# Patient Record
Sex: Female | Born: 1985 | Race: White | Hispanic: No | Marital: Single | Smoking: Former smoker
Health system: Southern US, Community
[De-identification: ages and names within clinical notes are randomized; demographics above are authoritative.]

## PROBLEM LIST (undated history)

## (undated) ENCOUNTER — Inpatient Hospital Stay (HOSPITAL_COMMUNITY): Payer: Self-pay

## (undated) DIAGNOSIS — Z789 Other specified health status: Secondary | ICD-10-CM

## (undated) DIAGNOSIS — O039 Complete or unspecified spontaneous abortion without complication: Secondary | ICD-10-CM

## (undated) HISTORY — PX: NO PAST SURGERIES: SHX2092

---

## 2016-10-24 ENCOUNTER — Other Ambulatory Visit: Payer: Self-pay | Admitting: Obstetrics and Gynecology

## 2016-10-24 ENCOUNTER — Inpatient Hospital Stay (HOSPITAL_BASED_OUTPATIENT_CLINIC_OR_DEPARTMENT_OTHER)
Admission: EM | Admit: 2016-10-24 | Discharge: 2016-10-24 | Disposition: A | Discharge status: Home / Self Care | Payer: Self-pay | Attending: Obstetrics and Gynecology | Admitting: Obstetrics and Gynecology

## 2016-10-24 ENCOUNTER — Encounter (HOSPITAL_BASED_OUTPATIENT_CLINIC_OR_DEPARTMENT_OTHER): Payer: Self-pay | Admitting: *Deleted

## 2016-10-24 ENCOUNTER — Telehealth: Payer: Self-pay | Admitting: Obstetrics and Gynecology

## 2016-10-24 DIAGNOSIS — O034 Incomplete spontaneous abortion without complication: Secondary | ICD-10-CM | POA: Diagnosis present

## 2016-10-24 DIAGNOSIS — O039 Complete or unspecified spontaneous abortion without complication: Secondary | ICD-10-CM | POA: Insufficient documentation

## 2016-10-24 DIAGNOSIS — O99332 Smoking (tobacco) complicating pregnancy, second trimester: Secondary | ICD-10-CM | POA: Insufficient documentation

## 2016-10-24 DIAGNOSIS — F17200 Nicotine dependence, unspecified, uncomplicated: Secondary | ICD-10-CM | POA: Insufficient documentation

## 2016-10-24 DIAGNOSIS — O2620 Pregnancy care for patient with recurrent pregnancy loss, unspecified trimester: Secondary | ICD-10-CM | POA: Insufficient documentation

## 2016-10-24 DIAGNOSIS — Z3A16 16 weeks gestation of pregnancy: Secondary | ICD-10-CM | POA: Insufficient documentation

## 2016-10-24 HISTORY — DX: Complete or unspecified spontaneous abortion without complication: O03.9

## 2016-10-24 LAB — HCG, QUANTITATIVE, PREGNANCY: HCG, BETA CHAIN, QUANT, S: 131 m[IU]/mL — AB (ref <5)

## 2016-10-24 LAB — URINE MICROSCOPIC-ADD ON

## 2016-10-24 LAB — URINALYSIS, ROUTINE W REFLEX MICROSCOPIC
Bilirubin Urine: NEGATIVE
Glucose, UA: NEGATIVE mg/dL
Ketones, ur: NEGATIVE mg/dL
NITRITE: NEGATIVE
PROTEIN: 30 mg/dL — AB
SPECIFIC GRAVITY, URINE: 1.016 (ref 1.005–1.030)
pH: 6.5 (ref 5.0–8.0)

## 2016-10-24 LAB — PREGNANCY, URINE: PREG TEST UR: POSITIVE — AB

## 2016-10-24 LAB — ABO/RH: ABO/RH(D): B POS

## 2016-10-24 MED ORDER — IBUPROFEN 600 MG PO TABS: 600.0000 mg | ORAL_TABLET | Freq: Four times a day (QID) | ORAL | 1 refills | Status: DC | PRN

## 2016-10-24 MED ORDER — OXYTOCIN 10 UNIT/ML IJ SOLN
INTRAMUSCULAR | Status: AC
  Filled 2016-10-24: qty 4

## 2016-10-24 MED ORDER — METOCLOPRAMIDE HCL 5 MG/ML IJ SOLN
10.0000 mg | Freq: Once | INTRAMUSCULAR | Status: AC
  Administered 2016-10-24: 10 mg via INTRAVENOUS
  Filled 2016-10-24: qty 2

## 2016-10-24 NOTE — ED Provider Notes (Signed)
[facility] Provider Note   CSN: [id number] Arrival date & time: 10/24/16  [id number]     History   Chief Complaint Chief Complaint  Patient presents with  . Miscarriage    HPI Tanya Jacobs is a 30 y.o. female.  HPI Patient is a G4 P0 presenting with abdominal contractions and vaginal bleeding. She's been having the abdominal pain for the past week. Worsened. Denies any fever or chills. States she was seen a week ago for prenatal visit by her OB/GYN. Unable to find fetal heartbeat. Advised to return immediately to emergency department should her symptoms worsen. Her last menstrual period was early August 2017. Unknown blood type Past Medical History:  Diagnosis Date  . Miscarriage     There are no active problems to display for this patient.   History reviewed. No pertinent surgical history.  OB History    Gravida Para Term Preterm AB Living   4 0     3 0   SAB TAB Ectopic Multiple Live Births                  Obstetric Comments   Patient states she was told one week ago that she was approximately [redacted] weeks pregnant which was comfirmed by        Home Medications    Prior to Admission medications   Not on File    Family History No family history on file.  Social History Social History  Substance Use Topics  . Smoking status: Current Every Day Smoker  . Smokeless tobacco: Never Used  . Alcohol use Yes     Allergies   Patient has no known allergies.   Review of Systems Review of Systems  Constitutional: Negative for chills and fever.  Gastrointestinal: Positive for abdominal distention and abdominal pain. Negative for diarrhea, nausea and vomiting.  Genitourinary: Positive for pelvic pain and vaginal bleeding. Negative for vaginal discharge and vaginal pain.  Musculoskeletal: Negative for back pain and neck pain.  Skin: Negative for rash and wound.  Neurological: Negative for weakness and numbness.  All other systems reviewed and are  negative.    Physical Exam Updated Vital Signs BP 142/90 (BP Location: Right Arm)   Pulse (!) 56   Temp 98.4 F (36.9 C) (Oral)   LMP 06/30/2016   SpO2 94%   Physical Exam  Constitutional: She is oriented to person, place, and time. She appears well-developed and well-nourished. She appears distressed.  HENT:  Head: Normocephalic and atraumatic.  Mouth/Throat: Oropharynx is clear and moist.  Eyes: EOM are normal. Pupils are equal, round, and reactive to light.  Neck: Normal range of motion. Neck supple.  Cardiovascular: Normal rate and regular rhythm.   Pulmonary/Chest: Effort normal and breath sounds normal.  Abdominal: Soft. Bowel sounds are normal. She exhibits distension. There is tenderness. There is no rebound and no guarding.  Uterine fundus palpated inferior to umbilicus. Tenderness with palpation.  Genitourinary:  Genitourinary Comments: Dark blood in the vaginal vault. Dark tissue in the cervical os  Musculoskeletal: Normal range of motion. She exhibits no edema or tenderness.  Neurological: She is alert and oriented to person, place, and time.  Skin: Skin is warm and dry. Capillary refill takes less than 2 seconds. No rash noted. No erythema.  Psychiatric: She has a normal mood and affect. Her behavior is normal.  Nursing note and vitals reviewed.    ED Treatments / Results  Labs (all labs ordered are listed, but only abnormal results  are displayed) Labs Reviewed  URINALYSIS, ROUTINE W REFLEX MICROSCOPIC (NOT AT [facility]) - Abnormal; Notable for the following:       Result Value   APPearance TURBID (*)    Hgb urine dipstick LARGE (*)    Protein, ur 30 (*)    Leukocytes, UA SMALL (*)    All other components within normal limits  PREGNANCY, URINE - Abnormal; Notable for the following:    Preg Test, Ur POSITIVE (*)    All other components within normal limits  HCG, QUANTITATIVE, PREGNANCY - Abnormal; Notable for the following:    hCG, Beta Chain, Quant, S 131 (*)     All other components within normal limits  URINE MICROSCOPIC-ADD ON - Abnormal; Notable for the following:    Squamous Epithelial / LPF 0-5 (*)    Bacteria, UA FEW (*)    All other components within normal limits    EKG  EKG Interpretation None       Radiology No results found.  Procedures Procedures (including critical care time)  Medications Ordered in ED Medications  oxytocin (PITOCIN) 10 UNIT/ML injection (not administered)  metoCLOPramide (REGLAN) injection 10 mg (not administered)     Initial Impression / Assessment and Plan / ED Course  I have reviewed the triage vital signs and the nursing notes.  Pertinent labs & imaging results that were available during my care of the patient were reviewed by me and considered in my medical decision making (see chart for details).  Clinical Course    Delivered roughly 10 cm fetus at 0815. Fetus appears to be intact. No placenta noted. Patient's abdominal cramping has improved. She's much more comfortable. Discussed with Dr. Emelda FearFerguson. Recommend starting Pitocin 40 mg in 1 L of normal saline running at 125 mils per hour. Advises transfer to the MAU due to concern for retained products. He will accept in transfer.  CRITICAL CARE Performed by: Ranae PalmsYELVERTON, Avilyn Virtue Total critical care time: 25 minutes Critical care time was exclusive of separately billable procedures and treating other patients. Critical care was necessary to treat or prevent imminent or life-threatening deterioration. Critical care was time spent personally by me on the following activities: development of treatment plan with patient and/or surrogate as well as nursing, discussions with consultants, evaluation of patient's response to treatment, examination of patient, obtaining history from patient or surrogate, ordering and performing treatments and interventions, ordering and review of laboratory studies, ordering and review of radiographic studies, pulse oximetry  and re-evaluation of patient's condition. Final Clinical Impressions(s) / ED Diagnoses   Final diagnoses:  Incomplete spontaneous abortion    New Prescriptions New Prescriptions   No medications on file     Loren Raceravid Lashae Wollenberg, MD 10/24/16 [phone]

## 2016-10-24 NOTE — MAU Note (Signed)
Only one IV charted upon pt arrival but pt arrived with two IVs.  [facility] each [facility].  Both IV removed.  Catheters intact.  Clean, dry, intact.

## 2016-10-24 NOTE — ED Notes (Signed)
Patient presented to ED with intermittent lower abdominal cramping and vaginal bleeding.  States she is [redacted] weeks pregnant with a confirmed ultrasound on 10/16/16, and a confirmed fetal demise, with no HR found.  Patient is from [place].  Patient presented at 0727 to the ED.  Fetus was delivered at 0820 with no active HR or RR.  Large blood clot was delivered within 2 minutes of fetus. Products of conception was placed in sterile specimen containers, labeled with patient's labels.  Fundus massage was given for 10 minutes.  Mild bleeding noted from vagina.

## 2016-10-24 NOTE — ED Notes (Addendum)
V/O for Pitocin 40 mg in 1000 cc of Normal Saline, to infuse at 125 mg per hour by Dr. Ranae PalmsYelverton.

## 2016-10-24 NOTE — MAU Provider Note (Signed)
  History     CSN: [id number]  Arrival date and time: 10/24/16 [id number]   None     Chief Complaint  Patient presents with  . Miscarriage   HPI transferred from ED [place] for miscarriage, after spont passage of autolysed fetus. Pitocin infusing at 125/hr. +cramping. ABO RH by pt hx , she's confident it is Rh PO  Past Medical History:  Diagnosis Date  . Miscarriage     Past Surgical History:  Procedure Laterality Date  . NO PAST SURGERIES      History reviewed. No pertinent family history.  Social History  Substance Use Topics  . Smoking status: Current Every Day Smoker  . Smokeless tobacco: Never Used  . Alcohol use Yes    Allergies: No Known Allergies  Prescriptions Prior to Admission  Medication Sig Dispense Refill Last Dose  . Prenatal Vit-Fe Fumarate-FA (MULTIVITAMIN-PRENATAL) 27-0.8 MG TABS tablet Take 1 tablet by mouth daily at 12 noon.   Past Week at Unknown time    ROS Physical Exam   Blood pressure 114/73, pulse 66, temperature 98 F (36.7 C), temperature source Oral, resp. rate 17, last menstrual period 06/30/2016, SpO2 96 %.  Physical Exam  Constitutional: She appears well-developed and well-nourished.  HENT:  Head: Normocephalic.  Eyes: Pupils are equal, round, and reactive to light.  Neck: Normal range of motion.  Cardiovascular: Normal rate.   Respiratory: Effort normal.  GI: Soft.  Genitourinary: Vagina normal.  Genitourinary Comments: EFG heavy blood Vag: Clots 200 cc removed. Cx 2 cm open, with tissue, able to be extracted with ring forceps from lower ut segment.  see procedure note  MAU Course  Procedures Ring Forceps used to grasp tissue in the internal os, and lower ut segment, with  Ultrasound used at bedside to confirm by transabdominal scan that the uterus is satisfactorily evacuated and no significant tissue in uterine cavity.  There is a small simple left ovarian cyst 4 cm max diameter in left adnexa. Normal pelvic  fluid.  MDM Exam, uterine extraction of POC's , u/s of uterus.  Assessment and Plan  Spontaneous abortion, 16 wk. ABO Rh ordered. Discharged at 1:30 pm stable for d/c   Keefer Soulliere V 10/24/2016, 12:48 PM

## 2016-10-24 NOTE — ED Triage Notes (Signed)
Patient and partner states the patient was told one week ago in [place], that she is currently having a miscarriage and a fetal demise.  Bleeding for one week, worse last night with abdominal pain.

## 2016-10-24 NOTE — MAU Note (Signed)
Pt arrived via [facility] from [facility] [place] [facility].  Pt states she delivered the baby at [facility].  Pt was given pain medication by [facility].

## 2016-10-24 NOTE — ED Notes (Signed)
Pt left with [facility] 

## 2016-10-24 NOTE — Discharge Instructions (Signed)

## 2016-10-26 NOTE — Telephone Encounter (Signed)
Call received from [facility] [place] regarding this pt , who has passed fetus and not placenta,  Pt accepted in transfer.

## 2017-07-08 ENCOUNTER — Other Ambulatory Visit (HOSPITAL_COMMUNITY): Payer: Self-pay | Admitting: Nurse Practitioner

## 2017-07-08 DIAGNOSIS — Z3682 Encounter for antenatal screening for nuchal translucency: Secondary | ICD-10-CM

## 2017-07-08 LAB — OB RESULTS CONSOLE GC/CHLAMYDIA
CHLAMYDIA, DNA PROBE: NEGATIVE
Gonorrhea: NEGATIVE

## 2017-07-08 LAB — OB RESULTS CONSOLE RUBELLA ANTIBODY, IGM: Rubella: IMMUNE

## 2017-07-08 LAB — OB RESULTS CONSOLE HIV ANTIBODY (ROUTINE TESTING): HIV: NONREACTIVE

## 2017-07-22 ENCOUNTER — Encounter (HOSPITAL_COMMUNITY): Payer: Self-pay | Admitting: *Deleted

## 2017-07-23 ENCOUNTER — Encounter (HOSPITAL_COMMUNITY): Payer: Self-pay

## 2017-07-23 ENCOUNTER — Ambulatory Visit (HOSPITAL_COMMUNITY)
Admission: RE | Admit: 2017-07-23 | Discharge: 2017-07-23 | Disposition: A | Discharge status: Home / Self Care | Payer: Medicaid Other | Source: Ambulatory Visit | Attending: Nurse Practitioner | Admitting: Nurse Practitioner

## 2017-07-23 DIAGNOSIS — Z3682 Encounter for antenatal screening for nuchal translucency: Secondary | ICD-10-CM | POA: Insufficient documentation

## 2017-07-23 DIAGNOSIS — Z3A12 12 weeks gestation of pregnancy: Secondary | ICD-10-CM | POA: Insufficient documentation

## 2017-07-28 ENCOUNTER — Inpatient Hospital Stay (HOSPITAL_COMMUNITY)
Admission: AD | Admit: 2017-07-28 | Discharge: 2017-07-28 | Disposition: A | Discharge status: Home / Self Care | Payer: Medicaid Other | Source: Ambulatory Visit | Attending: Family Medicine | Admitting: Family Medicine

## 2017-07-28 ENCOUNTER — Encounter (HOSPITAL_COMMUNITY): Payer: Self-pay | Admitting: *Deleted

## 2017-07-28 DIAGNOSIS — W19XXXA Unspecified fall, initial encounter: Secondary | ICD-10-CM

## 2017-07-28 DIAGNOSIS — Y92009 Unspecified place in unspecified non-institutional (private) residence as the place of occurrence of the external cause: Secondary | ICD-10-CM

## 2017-07-28 DIAGNOSIS — R55 Syncope and collapse: Secondary | ICD-10-CM

## 2017-07-28 DIAGNOSIS — R079 Chest pain, unspecified: Secondary | ICD-10-CM | POA: Insufficient documentation

## 2017-07-28 HISTORY — DX: Other specified health status: Z78.9

## 2017-07-28 LAB — URINALYSIS, ROUTINE W REFLEX MICROSCOPIC
BILIRUBIN URINE: NEGATIVE
Bacteria, UA: NONE SEEN
GLUCOSE, UA: NEGATIVE mg/dL
HGB URINE DIPSTICK: NEGATIVE
KETONES UR: NEGATIVE mg/dL
Nitrite: NEGATIVE
PROTEIN: 30 mg/dL — AB
Specific Gravity, Urine: 1.025 (ref 1.005–1.030)
pH: 6 (ref 5.0–8.0)

## 2017-07-28 LAB — GLUCOSE, CAPILLARY: Glucose-Capillary: 84 mg/dL (ref 65–99)

## 2017-07-28 NOTE — MAU Note (Signed)
Pt states she blacked out about an hour ago, was sitting outside & got hot, went inside, then woke up on the floor. Her friends said she hit her head very hard, has red raised area on R forehead.  Also hit right shoulder.  States she didn't know where she was when she woke up.  Pt states she ate a meal @ 1000 but has not drank much water today.  Denies abd pain or bleeding.

## 2017-07-28 NOTE — MAU Note (Signed)
Pt reports she passed out approximately 1 hour ago.  Reports she was sitting outside, returned inside because she became too hot.  Once inside, woke up lying in the floor.  Pt states upon awakening, she realized she hit her head and shoulder on the left side.  Reddened area noted on L forehead & shoulder.  Denies abdominal cramping or bleeding.

## 2017-07-28 NOTE — Discharge Instructions (Signed)
Near-Syncope °Near-syncope is when you suddenly get weak or dizzy, or you feel like you might pass out (faint). During an episode of near-syncope, you may: °· Feel dizzy or light-headed. °· Feel sick to your stomach (nauseous). °· See all white or all black. °· Have cold, clammy skin. ° °If you passed out, get help right away.Call your local emergency services (911 in the [place] Do not drive yourself to the hospital. °Follow these instructions at home: °Pay attention to any changes in your symptoms. Take these actions to help with your condition: °· Have someone stay with you until you feel stable. °· Do not drive, use machinery, or play sports until your doctor says it is okay. °· Keep all follow-up visits as told by your doctor. This is important. °· If you start to feel like you might pass out, lie down right away and raise (elevate) your feet above the level of your heart. Breathe deeply and steadily. Wait until all of the symptoms are gone. °· Drink enough fluid to keep your pee (urine) clear or pale yellow. °· If you are taking blood pressure or heart medicine, get up slowly and spend many minutes getting ready to sit and then stand. This can help with dizziness. °· Take over-the-counter and prescription medicines only as told by your doctor. ° °Get help right away if: °· You have a very bad headache. °· You have unusual pain in your chest, tummy, or back. °· You are bleeding from your mouth or rectum. °· You have black or tarry poop (stool). °· You have a very fast or uneven heartbeat (palpitations). °· You pass out one time or more than once. °· You have jerky movements that you cannot control (seizure). °· You are confused. °· You have trouble walking. °· You are very weak. °· You have vision problems. °These symptoms may be an emergency. Do not wait to see if the symptoms will go away. Get medical help right away. Call your local emergency services (911 in the [place] Do not drive yourself to the  hospital. °This information is not intended to replace advice given to you by your health care provider. Make sure you discuss any questions you have with your health care provider. °Document Released: 05/04/2008 Document Revised: 04/23/2016 Document Reviewed: 07/31/2015 °Elsevier Interactive Patient Education © 2017 Elsevier Inc. ° °

## 2017-07-28 NOTE — MAU Provider Note (Signed)
S:  HPI: Beverlye Jacobs is a 31 year old female that presents with the complaints of a syncopal episode. One hour prior to arrival she was sitting outside, stood up to go inside, made it inside but them remembers waking up on the floor. Pt did strike left side of forehead and left shoulder. Pt endorses eating today (bowl of Cheerios with milk) but states she has not had much fluid intake, and has not eaten lunch. [facility]   O:  Vitals:   07/28/17 1300 07/28/17 1551  BP: (!) 97/58 (!) 99/51  Pulse: 75 72  Resp: 18 18  Temp: 98.1 F (36.7 C) 97.8 F (36.6 C)  TempSrc: Oral Oral  SpO2: 99% 100%    .Marland KitchenReview of Systems  Respiratory: Negative for shortness of breath.   Cardiovascular: Negative for chest pain and palpitations.  Musculoskeletal: Positive for falls and joint pain.  Neurological: Positive for dizziness and loss of consciousness. Negative for headaches.   Marland Kitchen.Physical Exam  Constitutional: She is oriented to person, place, and time and well-developed, well-nourished, and in no distress.  HENT:  Head: Head is with contusion.    Cardiovascular: Normal rate, regular rhythm, S1 normal and S2 normal.   Musculoskeletal:       Right shoulder: She exhibits tenderness and pain. She exhibits normal pulse and normal strength.  Neurological: She is alert and oriented to person, place, and time. She has normal sensation, normal strength and intact cranial nerves. GCS score is 15.   Recent Results (from the past 2160 hour(s))  Urinalysis, Routine w reflex microscopic     Status: Abnormal   Collection Time: 07/28/17  1:04 PM  Result Value Ref Range   Color, Urine Tanya Jacobs (A) YELLOW    Comment: BIOCHEMICALS MAY BE AFFECTED BY COLOR   APPearance CLOUDY (A) CLEAR   Specific Gravity, Urine 1.025 1.005 - 1.030   pH 6.0 5.0 - 8.0   Glucose, UA NEGATIVE NEGATIVE mg/dL   Hgb urine dipstick NEGATIVE NEGATIVE   Bilirubin Urine NEGATIVE NEGATIVE   Ketones, ur NEGATIVE NEGATIVE mg/dL   Protein, ur 30 (A) NEGATIVE mg/dL   Nitrite NEGATIVE NEGATIVE   Leukocytes, UA TRACE (A) NEGATIVE   RBC / HPF 0-5 0 - 5 RBC/hpf   WBC, UA 6-30 0 - 5 WBC/hpf   Bacteria, UA NONE SEEN NONE SEEN   Squamous Epithelial / LPF 6-30 (A) NONE SEEN   Mucus PRESENT   Glucose, capillary     Status: None   Collection Time: 07/28/17  2:26 PM  Result Value Ref Range   Glucose-Capillary 84 65 - 99 mg/dL    MDM:  + fetal heart tones via doppler  Glucose WNL UA Orthostatic vitals WNL  Patient denies HA at the time of discharge.    A:  1. Syncope, vasovagal   2. Fall in home, initial encounter     P:  Discharge home in stable condition If symptoms worsen go to [facility] ED Discussed healthy eat plan, with frequent snacks  I confirm that I have verified the information documented in the nurse practitioner's note and that I have also personally reperformed the physical exam and all medical decision making activities.   Duane Lope, NP 07/28/2017 6:10 PM

## 2017-11-17 LAB — OB RESULTS CONSOLE HIV ANTIBODY (ROUTINE TESTING): HIV: NONREACTIVE

## 2017-11-30 NOTE — L&D Delivery Note (Signed)
Patient is a 32 y.o. now [id number] s/p NSVD at 4334w2d, who was admitted for SOL.  She progressed with augmentation to complete.  Cord clamping delayed by several minutes then clamped by me with supervision and cut by father of the baby.  Placenta intact and spontaneous, bleeding minimal.  Few labial abrasions noted but no lacerations requiring repair. She requests Depo for birth control.  Delivery Note At 3:36 PM a viable female was delivered via Vaginal, Spontaneous (Presentation: LOA).  APGAR: 8, 9; weight pending.   Placenta status: intact.  Cord: 3V with the following complications: nuchal x1, reduced after delivery with no complications.  Cord blood sent.  Anesthesia: Epidural Episiotomy: None Lacerations: None Suture Repair: None Est. Blood Loss (mL): 200  Mom to postpartum.  Baby to Couplet care / Skin to Skin.  Ellwood DenseAlison Rumball, DO 01/25/18, 3:59 PM

## 2017-12-20 ENCOUNTER — Encounter (HOSPITAL_COMMUNITY): Payer: Self-pay | Admitting: *Deleted

## 2017-12-20 ENCOUNTER — Inpatient Hospital Stay (HOSPITAL_COMMUNITY)
Admission: AD | Admit: 2017-12-20 | Discharge: 2017-12-20 | Disposition: A | Discharge status: Home / Self Care | Payer: Medicaid Other | Source: Ambulatory Visit | Attending: Obstetrics and Gynecology | Admitting: Obstetrics and Gynecology

## 2017-12-20 DIAGNOSIS — O2240 Hemorrhoids in pregnancy, unspecified trimester: Secondary | ICD-10-CM | POA: Diagnosis not present

## 2017-12-20 DIAGNOSIS — O99613 Diseases of the digestive system complicating pregnancy, third trimester: Secondary | ICD-10-CM | POA: Diagnosis not present

## 2017-12-20 DIAGNOSIS — Z8249 Family history of ischemic heart disease and other diseases of the circulatory system: Secondary | ICD-10-CM | POA: Diagnosis not present

## 2017-12-20 DIAGNOSIS — R109 Unspecified abdominal pain: Secondary | ICD-10-CM

## 2017-12-20 DIAGNOSIS — Z809 Family history of malignant neoplasm, unspecified: Secondary | ICD-10-CM | POA: Insufficient documentation

## 2017-12-20 DIAGNOSIS — Z833 Family history of diabetes mellitus: Secondary | ICD-10-CM | POA: Insufficient documentation

## 2017-12-20 DIAGNOSIS — F17200 Nicotine dependence, unspecified, uncomplicated: Secondary | ICD-10-CM | POA: Insufficient documentation

## 2017-12-20 DIAGNOSIS — Z888 Allergy status to other drugs, medicaments and biological substances status: Secondary | ICD-10-CM | POA: Insufficient documentation

## 2017-12-20 DIAGNOSIS — O2243 Hemorrhoids in pregnancy, third trimester: Secondary | ICD-10-CM | POA: Diagnosis not present

## 2017-12-20 DIAGNOSIS — K921 Melena: Secondary | ICD-10-CM | POA: Diagnosis present

## 2017-12-20 DIAGNOSIS — O9989 Other specified diseases and conditions complicating pregnancy, childbirth and the puerperium: Secondary | ICD-10-CM | POA: Diagnosis not present

## 2017-12-20 DIAGNOSIS — K5900 Constipation, unspecified: Secondary | ICD-10-CM | POA: Insufficient documentation

## 2017-12-20 DIAGNOSIS — Z8759 Personal history of other complications of pregnancy, childbirth and the puerperium: Secondary | ICD-10-CM | POA: Diagnosis not present

## 2017-12-20 DIAGNOSIS — Z3A34 34 weeks gestation of pregnancy: Secondary | ICD-10-CM | POA: Diagnosis not present

## 2017-12-20 DIAGNOSIS — O99333 Smoking (tobacco) complicating pregnancy, third trimester: Secondary | ICD-10-CM | POA: Insufficient documentation

## 2017-12-20 LAB — URINALYSIS, ROUTINE W REFLEX MICROSCOPIC
Bilirubin Urine: NEGATIVE
GLUCOSE, UA: NEGATIVE mg/dL
Hgb urine dipstick: NEGATIVE
KETONES UR: 5 mg/dL — AB
Nitrite: NEGATIVE
Protein, ur: 30 mg/dL — AB
Specific Gravity, Urine: 1.024 (ref 1.005–1.030)
pH: 6 (ref 5.0–8.0)

## 2017-12-20 LAB — OB RESULTS CONSOLE GBS: GBS: NEGATIVE

## 2017-12-20 NOTE — Discharge Instructions (Signed)
Constipation, Adult °Constipation is when a person has fewer bowel movements in a week than normal, has difficulty having a bowel movement, or has stools that are dry, hard, or larger than normal. Constipation may be caused by an underlying condition. It may become worse with age if a person takes certain medicines and does not take in enough fluids. °Follow these instructions at home: °Eating and drinking ° °· Eat foods that have a lot of fiber, such as fresh fruits and vegetables, whole grains, and beans. °· Limit foods that are high in fat, low in fiber, or overly processed, such as french fries, hamburgers, cookies, candies, and soda. °· Drink enough fluid to keep your urine clear or pale yellow. °General instructions °· Exercise regularly or as told by your health care provider. °· Go to the restroom when you have the urge to go. Do not hold it in. °· Take over-the-counter and prescription medicines only as told by your health care provider. These include any fiber supplements. °· Practice pelvic floor retraining exercises, such as deep breathing while relaxing the lower abdomen and pelvic floor relaxation during bowel movements. °· Watch your condition for any changes. °· Keep all follow-up visits as told by your health care provider. This is important. °Contact a health care provider if: °· You have pain that gets worse. °· You have a fever. °· You do not have a bowel movement after 4 days. °· You vomit. °· You are not hungry. °· You lose weight. °· You are bleeding from the anus. °· You have thin, pencil-like stools. °Get help right away if: °· You have a fever and your symptoms suddenly get worse. °· You leak stool or have blood in your stool. °· Your abdomen is bloated. °· You have severe pain in your abdomen. °· You feel dizzy or you faint. °This information is not intended to replace advice given to you by your health care provider. Make sure you discuss any questions you have with your health care  provider. °Document Released: 08/14/2004 Document Revised: 06/05/2016 Document Reviewed: 05/06/2016 °Elsevier Interactive Patient Education © 2018 Elsevier Inc ° °Safe Medications in Pregnancy  ° °Acne: °Benzoyl Peroxide °Salicylic Acid ° °Backache/Headache: °Tylenol: 2 regular strength every 4 hours OR °             2 Extra strength every 6 hours ° °Colds/Coughs/Allergies: °Benadryl (alcohol free) 25 mg every 6 hours as needed °Breath right strips °Claritin °Cepacol throat lozenges °Chloraseptic throat spray °Cold-Eeze- up to three times per day °Cough drops, alcohol free °Flonase (by prescription only) °Guaifenesin °Mucinex °Robitussin DM (plain only, alcohol free) °Saline nasal spray/drops °Sudafed (pseudoephedrine) & Actifed ** use only after [redacted] weeks gestation and if you do not have high blood pressure °Tylenol °Vicks Vaporub °Zinc lozenges °Zyrtec  ° °Constipation: °Colace °Ducolax suppositories °Fleet enema °Glycerin suppositories °Metamucil °Milk of magnesia °Miralax °Senokot °Smooth move tea ° °Diarrhea: °Kaopectate °Imodium A-D ° °*NO pepto Bismol ° °Hemorrhoids: °Anusol °Anusol HC °Preparation H °Tucks ° °Indigestion: °Tums °Maalox °Mylanta °Zantac  °Pepcid ° °Insomnia: °Benadryl (alcohol free) 25mg every 6 hours as needed °Tylenol PM °Unisom, no Gelcaps ° °Leg Cramps: °Tums °MagGel ° °Nausea/Vomiting:  °Bonine °Dramamine °Emetrol °Ginger extract °Sea bands °Meclizine  °Nausea medication to take during pregnancy:  °Unisom (doxylamine succinate 25 mg tablets) Take one tablet daily at bedtime. If symptoms are not adequately controlled, the dose can be increased to a maximum recommended dose of two tablets daily (1/2 tablet in the morning, 1/2   tablet mid-afternoon and one at bedtime). °Vitamin B6 100mg tablets. Take one tablet twice a day (up to 200 mg per day). ° °Skin Rashes: °Aveeno products °Benadryl cream or 25mg every 6 hours as needed °Calamine Lotion °1% cortisone cream ° °Yeast  infection: °Gyne-lotrimin 7 °Monistat 7 ° ° °**If taking multiple medications, please check labels to avoid duplicating the same active ingredients °**take medication as directed on the label °** Do not exceed 4000 mg of tylenol in 24 hours °**Do not take medications that contain aspirin or ibuprofen ° ° ° ° °

## 2017-12-20 NOTE — MAU Provider Note (Signed)
History     CSN: [id number]  Arrival date and time: 12/20/17 [id number]   First Provider Initiated Contact with Patient 12/20/17 [phone]     Chief Complaint  Patient presents with  . blood in stool  . Abdominal Pain   HPI Tanya Jacobs is a 32 y.o. G5P0040 at 4825w1d who presents with blood in her stool and abdominal pain. She states she was feeling constipated last night so she took milk of magnesia last night around midnight. Last bowel movement was 2 days ago but small and hard. She frequently strains to have a BM. She also reports lower abdominal cramping that she rates a 7/10 and has not tried anything for. She denies any contractions. She denies any leaking or bleeding. Reports good fetal movement. [facility]  OB History    Gravida Para Term Preterm AB Living   5 0     4 0   SAB TAB Ectopic Multiple Live Births   2 2 0 0 0      Obstetric Comments   Patient states she was told one week ago that she was approximately [redacted] weeks pregnant which was comfirmed by       Past Medical History:  Diagnosis Date  . Medical history non-contributory   . Miscarriage     Past Surgical History:  Procedure Laterality Date  . NO PAST SURGERIES      Family History  Problem Relation Age of Onset  . Cancer Maternal Grandmother   . Diabetes Paternal Grandmother   . Hypertension Paternal Grandfather     Social History   Tobacco Use  . Smoking status: Current Every Day Smoker  . Smokeless tobacco: Never Used  Substance Use Topics  . Alcohol use: Yes  . Drug use: Yes    Types: Marijuana    Allergies:  Allergies  Allergen Reactions  . Chlorine Shortness Of Breath    No medications prior to admission.    Review of Systems  Constitutional: Negative.  Negative for fatigue and fever.  HENT: Negative.   Respiratory: Negative.  Negative for shortness of breath.   Cardiovascular: Negative.  Negative for chest pain.  Gastrointestinal: Negative.  Negative for abdominal pain,  constipation, diarrhea, nausea and vomiting.  Genitourinary: Negative.  Negative for dysuria.  Neurological: Negative.  Negative for dizziness and headaches.   Physical Exam   Blood pressure 112/74, pulse 73, temperature 97.7 F (36.5 C), resp. rate 17, weight 157 lb 0.6 oz (71.2 kg), last menstrual period 04/25/2017, SpO2 98 %, unknown if currently breastfeeding.  Physical Exam  Nursing note and vitals reviewed. Constitutional: She is oriented to person, place, and time. She appears well-developed and well-nourished. No distress.  HENT:  Head: Normocephalic.  Eyes: Pupils are equal, round, and reactive to light.  Cardiovascular: Normal rate, regular rhythm and normal heart sounds.  Respiratory: Effort normal and breath sounds normal. No respiratory distress.  GI: Soft. Bowel sounds are normal. She exhibits no distension. There is no tenderness.  Genitourinary: Rectal exam shows internal hemorrhoid. Rectal exam shows no external hemorrhoid, no fissure and no tenderness.  Neurological: She is alert and oriented to person, place, and time.  Skin: Skin is warm and dry.  Psychiatric: She has a normal mood and affect. Her behavior is normal. Judgment and thought content normal.    Fetal Tracing:  Baseline: 125 Variability: moderate Accels: 15x15 Decels: none  Toco: 1 uc   MAU Course  Procedures Results for orders placed or performed  during the hospital encounter of 12/20/17 (from the past 24 hour(s))  Urinalysis, Routine w reflex microscopic     Status: Abnormal   Collection Time: 12/20/17  7:49 AM  Result Value Ref Range   Color, Urine Arleigh (A) YELLOW   APPearance HAZY (A) CLEAR   Specific Gravity, Urine 1.024 1.005 - 1.030   pH 6.0 5.0 - 8.0   Glucose, UA NEGATIVE NEGATIVE mg/dL   Hgb urine dipstick NEGATIVE NEGATIVE   Bilirubin Urine NEGATIVE NEGATIVE   Ketones, ur 5 (A) NEGATIVE mg/dL   Protein, ur 30 (A) NEGATIVE mg/dL   Nitrite NEGATIVE NEGATIVE   Leukocytes, UA  SMALL (A) NEGATIVE   RBC / HPF 0-5 0 - 5 RBC/hpf   WBC, UA 6-30 0 - 5 WBC/hpf   Bacteria, UA RARE (A) NONE SEEN   Squamous Epithelial / LPF 6-30 (A) NONE SEEN   Mucus PRESENT      MDM UA Urine culture NST- reactive Patient states abdominal pain is intermittent and worse when she has to go to the bathroom. No blood noted on exam. Patient states last BM this am had no blood. Assessment and Plan   1. Hemorrhoids during pregnancy in third trimester   2. Constipation during pregnancy in third trimester    -Discharge home in stable condition -Comfort measures for hemorrhoids discussed. Increase fluids and green leafy vegetables. OTC options for constipation reviewed. -Preterm labor precautions discussed -Patient advised to follow-up with [facility] as scheduled to continue prenatal care -Patient may return to MAU as needed or if her condition were to change or worsen  Rolm Bookbinder CNM 12/20/2017, 8:41 AM

## 2017-12-20 NOTE — MAU Note (Signed)
Patient c/o  + Blood in stool. Felt constipated last night and took milk of mag around midnight. Noticed a lot of blood in the toilet; has continued to bleed even when just urinating she reports.  +lower abdominal pain Sharp and cramping in nature Constant Rating pain 7/10

## 2017-12-21 LAB — CULTURE, OB URINE: Culture: <10000 — AB

## 2017-12-29 LAB — OB RESULTS CONSOLE HEPATITIS B SURFACE ANTIGEN: HEP B S AG: NEGATIVE

## 2018-01-25 ENCOUNTER — Encounter (HOSPITAL_COMMUNITY): Payer: Self-pay

## 2018-01-25 ENCOUNTER — Inpatient Hospital Stay (HOSPITAL_COMMUNITY): Payer: Medicaid Other | Admitting: Anesthesiology

## 2018-01-25 ENCOUNTER — Inpatient Hospital Stay (HOSPITAL_COMMUNITY)
Admission: AD | Admit: 2018-01-25 | Discharge: 2018-01-27 | DRG: 806 | Disposition: A | Discharge status: Home / Self Care | Payer: Medicaid Other | Source: Ambulatory Visit | Attending: Family Medicine | Admitting: Family Medicine

## 2018-01-25 DIAGNOSIS — F1290 Cannabis use, unspecified, uncomplicated: Secondary | ICD-10-CM | POA: Diagnosis present

## 2018-01-25 DIAGNOSIS — Z3A39 39 weeks gestation of pregnancy: Secondary | ICD-10-CM | POA: Diagnosis not present

## 2018-01-25 DIAGNOSIS — O99324 Drug use complicating childbirth: Secondary | ICD-10-CM | POA: Diagnosis present

## 2018-01-25 DIAGNOSIS — Z3483 Encounter for supervision of other normal pregnancy, third trimester: Secondary | ICD-10-CM | POA: Diagnosis present

## 2018-01-25 DIAGNOSIS — Z3493 Encounter for supervision of normal pregnancy, unspecified, third trimester: Secondary | ICD-10-CM

## 2018-01-25 DIAGNOSIS — O99334 Smoking (tobacco) complicating childbirth: Secondary | ICD-10-CM | POA: Diagnosis present

## 2018-01-25 DIAGNOSIS — F17200 Nicotine dependence, unspecified, uncomplicated: Secondary | ICD-10-CM | POA: Diagnosis present

## 2018-01-25 LAB — CBC
HCT: 35.1 % — ABNORMAL LOW (ref 36.0–46.0)
Hemoglobin: 11.9 g/dL — ABNORMAL LOW (ref 12.0–15.0)
MCH: 31.1 pg (ref 26.0–34.0)
MCHC: 33.9 g/dL (ref 30.0–36.0)
MCV: 91.6 fL (ref 78.0–100.0)
PLATELETS: 370 10*3/uL (ref 150–400)
RBC: 3.83 MIL/uL — AB (ref 3.87–5.11)
RDW: 13.1 % (ref 11.5–15.5)
WBC: 16.3 10*3/uL — AB (ref 4.0–10.5)

## 2018-01-25 LAB — RPR: RPR: NONREACTIVE

## 2018-01-25 LAB — ABO/RH: ABO/RH(D): B POS

## 2018-01-25 LAB — TYPE AND SCREEN
ABO/RH(D): B POS
ANTIBODY SCREEN: NEGATIVE

## 2018-01-25 MED ORDER — PHENYLEPHRINE 40 MCG/ML (10ML) SYRINGE FOR IV PUSH (FOR BLOOD PRESSURE SUPPORT)
80.0000 ug | PREFILLED_SYRINGE | INTRAVENOUS | Status: DC | PRN
  Filled 2018-01-25: qty 5

## 2018-01-25 MED ORDER — SOD CITRATE-CITRIC ACID 500-334 MG/5ML PO SOLN: 30.0000 mL | ORAL | Status: DC | PRN

## 2018-01-25 MED ORDER — ONDANSETRON HCL 4 MG/2ML IJ SOLN: 4.0000 mg | INTRAMUSCULAR | Status: DC | PRN

## 2018-01-25 MED ORDER — BENZOCAINE-MENTHOL 20-0.5 % EX AERO
1.0000 "application " | INHALATION_SPRAY | CUTANEOUS | Status: DC | PRN
  Administered 2018-01-25: 1 via TOPICAL
  Filled 2018-01-25: qty 56

## 2018-01-25 MED ORDER — SIMETHICONE 80 MG PO CHEW: 80.0000 mg | CHEWABLE_TABLET | ORAL | Status: DC | PRN

## 2018-01-25 MED ORDER — SENNOSIDES-DOCUSATE SODIUM 8.6-50 MG PO TABS
2.0000 | ORAL_TABLET | ORAL | Status: DC
  Administered 2018-01-25 – 2018-01-27 (×2): 2 via ORAL
  Filled 2018-01-25 (×2): qty 2

## 2018-01-25 MED ORDER — FENTANYL CITRATE (PF) 100 MCG/2ML IJ SOLN
100.0000 ug | INTRAMUSCULAR | Status: DC | PRN
Start: 2018-01-25 — End: 2018-01-25
  Administered 2018-01-25: 100 ug via INTRAVENOUS
  Filled 2018-01-25: qty 2

## 2018-01-25 MED ORDER — LACTATED RINGERS IV SOLN
INTRAVENOUS | Status: DC
  Administered 2018-01-25: 11:00:00 via INTRAVENOUS

## 2018-01-25 MED ORDER — ACETAMINOPHEN 325 MG PO TABS: 650.0000 mg | ORAL_TABLET | ORAL | Status: DC | PRN

## 2018-01-25 MED ORDER — LACTATED RINGERS IV SOLN: 500.0000 mL | Freq: Once | INTRAVENOUS | Status: DC

## 2018-01-25 MED ORDER — LIDOCAINE HCL (PF) 1 % IJ SOLN
INTRAMUSCULAR | Status: DC | PRN
  Administered 2018-01-25: 2 mL
  Administered 2018-01-25: 3 mL
  Administered 2018-01-25: 5 mL

## 2018-01-25 MED ORDER — TETANUS-DIPHTH-ACELL PERTUSSIS 5-2.5-18.5 LF-MCG/0.5 IM SUSP: 0.5000 mL | Freq: Once | INTRAMUSCULAR | Status: DC

## 2018-01-25 MED ORDER — FENTANYL 2.5 MCG/ML BUPIVACAINE 1/10 % EPIDURAL INFUSION (WH - ANES)
14.0000 mL/h | INTRAMUSCULAR | Status: DC | PRN
  Administered 2018-01-25 (×2): 14 mL/h via EPIDURAL
  Filled 2018-01-25 (×2): qty 100

## 2018-01-25 MED ORDER — ONDANSETRON HCL 4 MG PO TABS: 4.0000 mg | ORAL_TABLET | ORAL | Status: DC | PRN

## 2018-01-25 MED ORDER — TERBUTALINE SULFATE 1 MG/ML IJ SOLN
0.2500 mg | Freq: Once | INTRAMUSCULAR | Status: DC | PRN
  Filled 2018-01-25: qty 1

## 2018-01-25 MED ORDER — COCONUT OIL OIL
1.0000 "application " | TOPICAL_OIL | Status: DC | PRN
  Administered 2018-01-26: 1 via TOPICAL
  Filled 2018-01-25: qty 120

## 2018-01-25 MED ORDER — EPHEDRINE 5 MG/ML INJ
10.0000 mg | INTRAVENOUS | Status: DC | PRN
  Filled 2018-01-25: qty 2

## 2018-01-25 MED ORDER — PHENYLEPHRINE 40 MCG/ML (10ML) SYRINGE FOR IV PUSH (FOR BLOOD PRESSURE SUPPORT)
80.0000 ug | PREFILLED_SYRINGE | INTRAVENOUS | Status: DC | PRN
  Filled 2018-01-25: qty 5
  Filled 2018-01-25: qty 10

## 2018-01-25 MED ORDER — DIPHENHYDRAMINE HCL 50 MG/ML IJ SOLN
12.5000 mg | INTRAMUSCULAR | Status: DC | PRN
Start: 2018-01-25 — End: 2018-01-25

## 2018-01-25 MED ORDER — PRENATAL MULTIVITAMIN CH
1.0000 | ORAL_TABLET | Freq: Every day | ORAL | Status: DC
  Administered 2018-01-26 – 2018-01-27 (×2): 1 via ORAL
  Filled 2018-01-25 (×2): qty 1

## 2018-01-25 MED ORDER — ZOLPIDEM TARTRATE 5 MG PO TABS: 5.0000 mg | ORAL_TABLET | Freq: Every evening | ORAL | Status: DC | PRN

## 2018-01-25 MED ORDER — DIPHENHYDRAMINE HCL 25 MG PO CAPS: 25.0000 mg | ORAL_CAPSULE | Freq: Four times a day (QID) | ORAL | Status: DC | PRN

## 2018-01-25 MED ORDER — EPHEDRINE 5 MG/ML INJ: 10.0000 mg | INTRAVENOUS | Status: DC | PRN

## 2018-01-25 MED ORDER — LIDOCAINE HCL (PF) 1 % IJ SOLN
30.0000 mL | INTRAMUSCULAR | Status: DC | PRN
Start: 2018-01-25 — End: 2018-01-25
  Filled 2018-01-25: qty 30

## 2018-01-25 MED ORDER — DIBUCAINE 1 % RE OINT: 1.0000 "application " | TOPICAL_OINTMENT | RECTAL | Status: DC | PRN

## 2018-01-25 MED ORDER — ONDANSETRON HCL 4 MG/2ML IJ SOLN
4.0000 mg | Freq: Four times a day (QID) | INTRAMUSCULAR | Status: DC | PRN
  Administered 2018-01-25: 4 mg via INTRAVENOUS
  Filled 2018-01-25: qty 2

## 2018-01-25 MED ORDER — OXYCODONE-ACETAMINOPHEN 5-325 MG PO TABS: 2.0000 | ORAL_TABLET | ORAL | Status: DC | PRN

## 2018-01-25 MED ORDER — LACTATED RINGERS IV SOLN: 500.0000 mL | INTRAVENOUS | Status: DC | PRN

## 2018-01-25 MED ORDER — OXYTOCIN BOLUS FROM INFUSION
500.0000 mL | Freq: Once | INTRAVENOUS | Status: AC
  Administered 2018-01-25: 500 mL via INTRAVENOUS

## 2018-01-25 MED ORDER — IBUPROFEN 600 MG PO TABS
600.0000 mg | ORAL_TABLET | Freq: Four times a day (QID) | ORAL | Status: DC
  Administered 2018-01-25 – 2018-01-27 (×8): 600 mg via ORAL
  Filled 2018-01-25 (×8): qty 1

## 2018-01-25 MED ORDER — OXYTOCIN 40 UNITS IN LACTATED RINGERS INFUSION - SIMPLE MED
2.5000 [IU]/h | INTRAVENOUS | Status: DC
  Filled 2018-01-25: qty 1000

## 2018-01-25 MED ORDER — PHENYLEPHRINE 40 MCG/ML (10ML) SYRINGE FOR IV PUSH (FOR BLOOD PRESSURE SUPPORT): 80.0000 ug | PREFILLED_SYRINGE | INTRAVENOUS | Status: DC | PRN

## 2018-01-25 MED ORDER — OXYTOCIN 40 UNITS IN LACTATED RINGERS INFUSION - SIMPLE MED
1.0000 m[IU]/min | INTRAVENOUS | Status: DC
  Administered 2018-01-25: 2 m[IU]/min via INTRAVENOUS

## 2018-01-25 MED ORDER — WITCH HAZEL-GLYCERIN EX PADS: 1.0000 "application " | MEDICATED_PAD | CUTANEOUS | Status: DC | PRN

## 2018-01-25 MED ORDER — OXYCODONE-ACETAMINOPHEN 5-325 MG PO TABS: 1.0000 | ORAL_TABLET | ORAL | Status: DC | PRN

## 2018-01-25 NOTE — Anesthesia Preprocedure Evaluation (Signed)
Anesthesia Evaluation  Patient identified by MRN, date of birth, ID band Patient awake    Reviewed: Allergy & Precautions, Patient's Chart, lab work & pertinent test results  Airway Mallampati: II  TM Distance: >3 FB     Dental   Pulmonary Current Smoker,    Pulmonary exam normal        Cardiovascular negative cardio ROS Normal cardiovascular exam     Neuro/Psych negative neurological ROS     GI/Hepatic negative GI ROS, Neg liver ROS,   Endo/Other  negative endocrine ROS  Renal/GU negative Renal ROS     Musculoskeletal   Abdominal   Peds  Hematology negative hematology ROS (+)   Anesthesia Other Findings   Reproductive/Obstetrics (+) Pregnancy                             Lab Results  Component Value Date   WBC 16.3 (H) 01/25/2018   HGB 11.9 (L) 01/25/2018   HCT 35.1 (L) 01/25/2018   MCV 91.6 01/25/2018   PLT 370 01/25/2018    Anesthesia Physical Anesthesia Plan  ASA: II  Anesthesia Plan: Epidural   Post-op Pain Management:    Induction:   PONV Risk Score and Plan: Treatment may vary due to age or medical condition  Airway Management Planned: Natural Airway  Additional Equipment:   Intra-op Plan:   Post-operative Plan:   Informed Consent: I have reviewed the patients History and Physical, chart, labs and discussed the procedure including the risks, benefits and alternatives for the proposed anesthesia with the patient or authorized representative who has indicated his/her understanding and acceptance.     Plan Discussed with:   Anesthesia Plan Comments:         Anesthesia Quick Evaluation

## 2018-01-25 NOTE — MAU Note (Signed)
Pt reports contractions every 4 mins since 3:30am. Pt denies LOF. Reports some bloody mucous. Reports feeling baby move 1 hour prior to arrival. Cervix has not been checked.

## 2018-01-25 NOTE — Progress Notes (Signed)
Patient ID: Evelina BucyAmber Herrero, female   DOB: June 08, 1986, 32 y.o.   MRN: [id number]  Comfortable w/ epidural; VSS, afebrile; FHR 120s, +LTV, +accels, no decels; Ctx q 2-5 mins w/ Pit at 794mu/min; Cx 9/C/+1. Anticipate SVD.  Cam HaiSHAW, KIMBERLY CNM 01/25/2018 1:25 PM

## 2018-01-25 NOTE — Progress Notes (Signed)
Labor Progress Note Tanya Jacobs is a 32 y.o. G5P0040 at 7150w2d presented for SOL.  S: Patient comfortable with epidural  O:  BP (!) 91/56   Pulse 76   Temp 98.4 F (36.9 C) (Oral)   Resp 16   Ht 5' (1.524 m)   Wt 161 lb (73 kg)   LMP 04/25/2017 (Exact Date)   SpO2 97%   BMI 31.44 kg/m   ZOX:WRUEAVWUFHT:baseline rate 130, moderate variability, + acels, no decels Toco: ctx irregular  CVE: Dilation: 6.5 Effacement (%): 100 Cervical Position: Middle Station: 0, +1 Presentation: Vertex Exam by:: Ferne CoeS Earl RNC  A&P: 32 y.o. G5P0040 4950w2d here for SOL. #Labor: Progression slowed. AROM at 1000. Will augment with Pitocin. #Pain: epidural #FWB: Cat I #GBS negative  Ellwood DenseAlison Rumball, DO 10:05 AM

## 2018-01-25 NOTE — Progress Notes (Signed)
Patient asking to go outside and smoke at this time. Patient has not been out of bed since she came in from Labor and delivery, assisted patient up to void. Walking was a little unsteady at first but was able to walk to and from bathroom. Encouraged patient to rest at this time, stated understanding.

## 2018-01-25 NOTE — Progress Notes (Signed)
Patient stated she was "going out to get fresh air", RN educated patient on rest periods and not over doing it this soon. Patient stated feeling good. Assessment and vitals wnl at this time. Patient stepped out, father of baby in room caring for baby.

## 2018-01-25 NOTE — Anesthesia Procedure Notes (Signed)
Epidural Patient location during procedure: OB Start time: 01/25/2018 6:13 AM End time: 01/25/2018 6:20 AM  Staffing Anesthesiologist: Marcene DuosFitzgerald, Saesha Llerenas, MD Performed: anesthesiologist   Preanesthetic Checklist Completed: patient identified, site marked, surgical consent, pre-op evaluation, timeout performed, IV checked, risks and benefits discussed and monitors and equipment checked  Epidural Patient position: sitting Prep: site prepped and draped and DuraPrep Patient monitoring: continuous pulse ox and blood pressure Approach: midline Location: L4-L5 Injection technique: LOR air  Needle:  Needle type: Tuohy  Needle gauge: 17 G Needle length: 9 cm and 9 Needle insertion depth: 7 cm Catheter type: closed end flexible Catheter size: 19 Gauge Catheter at skin depth: 13 (12-->13cm when laid in lat decub position) cm Test dose: negative  Assessment Events: blood not aspirated, injection not painful, no injection resistance, negative IV test and no paresthesia

## 2018-01-25 NOTE — H&P (Addendum)
Graciemae Serita GrammesFernez is a 32 y.o. female presenting for SOL about 2 hours prior to arrival, no c/w SROM. Normal FM per her report. OB History    Gravida Para Term Preterm AB Living   5 0     4 0   SAB TAB Ectopic Multiple Live Births   2 2 0 0 0     Past Medical History:  Diagnosis Date  . Medical history non-contributory   . Miscarriage    Past Surgical History:  Procedure Laterality Date  . NO PAST SURGERIES     Family History: family history includes Cancer in her maternal grandmother; Diabetes in her paternal grandmother; Hypertension in her paternal grandfather. Social History:  reports that she has been smoking.  she has never used smokeless tobacco. She reports that she drinks alcohol. She reports that she uses drugs. Drug: Marijuana.     Maternal Diabetes: No Genetic Screening: Normal Maternal Ultrasounds/Referrals: Normal Fetal Ultrasounds or other Referrals:  None Maternal Substance Abuse:  Yes:  Type: Marijuana Significant Maternal Medications:  None Significant Maternal Lab Results:  None Other Comments:  None  Review of Systems  Constitutional: Negative for fever.  Eyes: Negative for double vision.  Respiratory: Negative for shortness of breath.   Cardiovascular: Negative for chest pain and palpitations.  Genitourinary: Negative for dysuria.   Maternal Medical History:  Reason for admission: Contractions.   Contractions: Onset was 3-5 hours ago.   Frequency: irregular.   Duration is approximately 1 minute.   Perceived severity is moderate.    Fetal activity: Perceived fetal activity is normal.   Last perceived fetal movement was within the past 12 hours.      Dilation: 4 Effacement (%): 90 Station: -1, 0 Exam by:: Camelia Enganielle Simpson RN Blood pressure 118/80, pulse 87, temperature (!) 97.4 F (36.3 C), temperature source Oral, resp. rate 19, height 5' (1.524 m), weight 73 kg (161 lb), last menstrual period 04/25/2017, SpO2 98 %, unknown if currently  breastfeeding.   Fetal Exam Fetal Monitor Review: Baseline rate: 130.  Variability: moderate (6-25 bpm).   Pattern: accelerations present and no decelerations.    Fetal State Assessment: Category I - tracings are normal.     Physical Exam  Constitutional: She is oriented to person, place, and time. She appears well-developed and well-nourished.  HENT:  Head: Normocephalic and atraumatic.  Eyes: EOM are normal. Pupils are equal, round, and reactive to light.  Neck: Normal range of motion. Neck supple.  Neurological: She is alert and oriented to person, place, and time.  Skin: Skin is warm and dry.  Psychiatric: She has a normal mood and affect. Her behavior is normal.    Prenatal labs: ABO, Rh:   Antibody:   Rubella:   RPR:    HBsAg:    HIV:    GBS:     Assessment/Plan: Admit to BS, SOL Augmentation if needed Cat 1 strip Patient desires epidural, fentanyl given pending labs Girl baby Breast Desires depo  Anticipate SVD  Loni MuseKate Timberlake 01/25/2018, 6:15 AM  OB FELLOW HISTORY AND PHYSICAL ATTESTATION  I have seen and examined this patient; I agree with above documentation in the resident's note.    Frederik PearJulie P Degele, MD OB Fellow 01/25/2018, 7:30 AM

## 2018-01-25 NOTE — Anesthesia Pain Management Evaluation Note (Signed)
  CRNA Pain Management Visit Note  Patient: Tanya BucyAmber Steeber, 32 y.o., female  "Hello I am a member of the anesthesia team at [facility]. We have an anesthesia team available at all times to provide care throughout the hospital, including epidural management and anesthesia for C-section. I don't know your plan for the delivery whether it a natural birth, water birth, IV sedation, nitrous supplementation, doula or epidural, but we want to meet your pain goals."   1.Was your pain managed to your expectations on prior hospitalizations?   No prior hospitalizations  2.What is your expectation for pain management during this hospitalization?     Epidural  3.How can we help you reach that goal? epidural  Record the patient's initial score and the patient's pain goal.   Pain: 0  Pain Goal: 4 [facility] wants you to be able to say your pain was always managed very well.  Madison HickmanGREGORY,Vertie Dibbern 01/25/2018

## 2018-01-26 NOTE — Anesthesia Postprocedure Evaluation (Signed)
Anesthesia Post Note  Patient: Tanya Jacobs  Procedure(s) Performed: AN AD HOC LABOR EPIDURAL     Patient location during evaluation: Mother Baby Anesthesia Type: Epidural Level of consciousness: awake Pain management: pain level controlled Vital Signs Assessment: post-procedure vital signs reviewed and stable Respiratory status: spontaneous breathing Cardiovascular status: stable Postop Assessment: patient able to bend at knees and epidural receding Anesthetic complications: no    Last Vitals:  Vitals:   01/25/18 2215 01/26/18 0626  BP: (!) 110/57 91/61  Pulse: 73 60  Resp: 16 16  Temp: 36.5 C 36.6 C  SpO2: 98%     Last Pain:  Vitals:   01/26/18 0626  TempSrc: Oral  PainSc:    Pain Goal:                 Tanya Jacobs,Tanya Jacobs

## 2018-01-26 NOTE — Progress Notes (Signed)
Post Partum Day 1 Subjective: up ad lib, voiding and tolerating PO  Some abdominal cramping. C/w baby not feeding well.   Objective: Blood pressure 91/61, pulse 60, temperature 97.8 F (36.6 C), temperature source Oral, resp. rate 16, height 5' (1.524 m), weight 73 kg (161 lb), last menstrual period 04/25/2017, SpO2 98 %, unknown if currently breastfeeding.  Physical Exam:  General: alert, cooperative, appears stated age and no distress Lochia: appropriate Uterine Fundus: firm Incision: n/a DVT Evaluation: No evidence of DVT seen on physical exam. No cords or calf tenderness.  Recent Labs    01/25/18 0547  HGB 11.9*  HCT 35.1*    Assessment/Plan: Plan for discharge tomorrow, Breastfeeding, Lactation consult and Social Work consult  SW [facility] use   LOS: 1 day   Tanya Jacobs 01/26/2018, 6:59 AM

## 2018-01-26 NOTE — Lactation Note (Signed)
This note was copied from a baby's chart. Lactation Consultation Note  Patient Name: Tanya Jacobs Rudy WUJWJ'XToday's Date: 01/26/2018 Reason for consult: Initial assessment;Difficult latch;Primapara;1st time breastfeeding;Term;Infant weight loss  Baby is 3223 hours old LC reviewed and updated the doc flow sheets.  As LC entered the room, mom had been set up with the DEBP and was pumping with #24 Flanges.  LC noted the base of the nipple to be snug and per mom having discomfort.  LC shut the pump off and assisted to apply coconut oil , and increased flange to #27, per mom more comfortable.  Mom pumped for 20 mins , without shield. And after pumping LC instructed mom how to hand express,  And mom repeated with 2-3 small drops noted. LC swabbed baby's mouth with gloved finger.   LC with with moms permission attempted to latch , few sucks and it was to painful for mom.  LC feels due to moms hyper sensitivity with both nipples and areola edema ,  To wears shells between feedings except when sleeping, and to pump every 2 1/2 -3 hours both breast  Starting with the initiation mode. LC assured mom to hand express, pump and hand express afterwards.  Save milk to be fed back to baby.  Due to baby having challenges with latching/ ( pain ) and large nipples for the baby's mouth, LC recommended  Finger feeding or using a bottle since this may take some time due to areola edema and moms hyper  - sensivity.  LC reviewed supply and demand, importance of consistent pattern.  Mom  And dad receptive to teaching.  Mother informed of post-discharge support and given phone number to the lactation department, including services for phone call assistance; out-patient appointments; and breastfeeding support group. List of other breastfeeding resources in the community given in the handout. Encouraged mother to call for problems or concerns related to breastfeeding.   Maternal Data Has patient been taught Hand Expression?:  Yes(2-3 drops noted after pumping on the left side ) Does the patient have breastfeeding experience prior to this delivery?: No  Feeding Feeding Type: Formula Nipple Type: Slow - flow Length of feed: 0 min  LATCH Score Latch: Repeated attempts needed to sustain latch, nipple held in mouth throughout feeding, stimulation needed to elicit sucking reflex.(to painful for baby top latch )  Audible Swallowing: None  Type of Nipple: Everted at rest and after stimulation(large semi compressible areolas )  Comfort (Breast/Nipple): Soft / non-tender  Hold (Positioning): Assistance needed to correctly position infant at breast and maintain latch.  LATCH Score: 6  Interventions Interventions: Breast feeding basics reviewed;DEBP;Shells;Assisted with latch;Skin to skin;Breast massage;Hand express;Breast compression;Adjust position;Support pillows;Position options  Lactation Tools Discussed/Used Tools: Pump;Shells;Flanges Flange Size: 27;24;Other (comment)(LC noted the #24 F was to snug and painful , switched to the #27 and much better ) Shell Type: Inverted Breast pump type: Double-Electric Breast Pump WIC Program: Yes Pump Review: Setup, frequency, and cleaning   Consult Status Consult Status: Follow-up(See LC note for plan ) Date: 01/27/18 Follow-up type: In-patient    Matilde SprangMargaret Ann Lonnie Reth 01/26/2018, 2:39 PM

## 2018-01-27 LAB — BIRTH TISSUE RECOVERY COLLECTION (PLACENTA DONATION)

## 2018-01-27 MED ORDER — IBUPROFEN 600 MG PO TABS: 600.0000 mg | ORAL_TABLET | Freq: Four times a day (QID) | ORAL | 0 refills | Status: AC

## 2018-01-27 NOTE — Progress Notes (Signed)
CSW assessment completed.  Full documentation to follow.  No barriers to discharge. 

## 2018-01-27 NOTE — Discharge Summary (Addendum)
OB Discharge Summary     Patient Name: Tanya Jacobs DOB: Dec 17, 1985 MRN: [id number]  Date of admission: 01/25/2018 Delivering MD: Cam HaiSHAW, KIMBERLY D   Date of discharge: 01/27/2018  Admitting diagnosis: 39.2 WEEKS CTX Intrauterine pregnancy: 1253w2d     Secondary diagnosis:  Principal Problem:   SVD (spontaneous vaginal delivery) Active Problems:   Supervision of normal pregnancy in third trimester  Additional problems: none     Discharge diagnosis: Term Pregnancy Delivered                                                                                                Post partum procedures:none  Augmentation: AROM and Pitocin  Complications: None  Hospital course:  Onset of Labor With Vaginal Delivery     32 y.o. yo [id number] at 1253w2d was admitted in Active Labor on 01/25/2018. Patient had an uncomplicated labor course as follows:  Membrane Rupture Time/Date: 10:02 AM ,01/25/2018   Intrapartum Procedures: Episiotomy: None [1]                                         Lacerations:  None [1]  Patient had a delivery of a Viable infant. 01/25/2018  Information for the patient's newborn:  Carron BrazenFernez, Girl Wilfred [[id number]][[id number]]       Pateint had an uncomplicated postpartum course.  She is ambulating, tolerating a regular diet, passing flatus, and urinating well. Patient is discharged home in stable condition on 01/27/18.   Physical exam  Vitals:   01/25/18 2215 01/26/18 0626 01/26/18 1855 01/27/18 0414  BP: (!) 110/57 91/61 110/67 102/64  Pulse: 73 60 72 67  Resp: 16 16 16 16   Temp: 97.7 F (36.5 C) 97.8 F (36.6 C) 98 F (36.7 C) 97.8 F (36.6 C)  TempSrc: Oral Oral  Oral  SpO2: 98%     Weight:      Height:       General: alert, cooperative and no distress Lochia: appropriate Uterine Fundus: firm Incision: N/A DVT Evaluation: No evidence of DVT seen on physical exam. No significant calf/ankle edema. Labs: Lab Results  Component Value Date   WBC 16.3 (H) 01/25/2018   HGB  11.9 (L) 01/25/2018   HCT 35.1 (L) 01/25/2018   MCV 91.6 01/25/2018   PLT 370 01/25/2018   No flowsheet data found.  Discharge instruction: per After Visit Summary and "Baby and Me Booklet".  After visit meds:  Allergies as of 01/27/2018      Reactions   Chlorine Shortness Of Breath      Medication List    TAKE these medications   ibuprofen 600 MG tablet Commonly known as:  ADVIL,MOTRIN Take 1 tablet (600 mg total) by mouth every 6 (six) hours.   multivitamin-prenatal 27-0.8 MG Tabs tablet Take 1 tablet by mouth daily at 12 noon.       Diet: routine diet  Activity: Advance as tolerated. Pelvic rest for 6 weeks.   [facility]  Postpartum contraception: Depo  Provera  Newborn Data: Live born female  Birth Weight: 6 lb 1.7 oz (2770 g) APGAR: 8, 9  Newborn Delivery   Birth date/time:  01/25/2018 15:36:00 Delivery type:  Vaginal, Spontaneous     Baby Feeding: Breast and bottle Disposition:home with mother   01/27/2018 Frederik Pear, MD

## 2018-01-27 NOTE — Lactation Note (Signed)
This note was copied from a baby's chart. Lactation Consultation Note  Patient Name: Tanya Jacobs: 01/27/2018  Mom is exclusively pumping due to sore nipples.  She plans on purchasing a pump.  Obtaining drops of colostrum.  Instructed to continue pumping every 2-3 hours until nipples healed enough to relatch.  Encouraged outpatient lactation services and support.   Maternal Data    Feeding Feeding Type: Bottle Fed - Formula Nipple Type: Slow - flow  LATCH Score                   Interventions    Lactation Tools Discussed/Used     Consult Status      Huston FoleyMOULDEN, Keyontae Huckeby S 01/27/2018, 9:09 AM

## 2018-01-27 NOTE — Discharge Instructions (Signed)
Vaginal Delivery, Care After °Refer to this sheet in the next few weeks. These instructions provide you with information about caring for yourself after vaginal delivery. Your health care provider may also give you more specific instructions. Your treatment has been planned according to current medical practices, but problems sometimes occur. Call your health care provider if you have any problems or questions. °What can I expect after the procedure? °After vaginal delivery, it is common to have: °· Some bleeding from your vagina. °· Soreness in your abdomen, your vagina, and the area of skin between your vaginal opening and your anus (perineum). °· Pelvic cramps. °· Fatigue. ° °Follow these instructions at home: °Medicines °· Take over-the-counter and prescription medicines only as told by your health care provider. °· If you were prescribed an antibiotic medicine, take it as told by your health care provider. Do not stop taking the antibiotic until it is finished. °Driving ° °· Do not drive or operate heavy machinery while taking prescription pain medicine. °· Do not drive for 24 hours if you received a sedative. °Lifestyle °· Do not drink alcohol. This is especially important if you are breastfeeding or taking medicine to relieve pain. °· Do not use tobacco products, including cigarettes, chewing tobacco, or e-cigarettes. If you need help quitting, ask your health care provider. °Eating and drinking °· Drink at least 8 eight-ounce glasses of water every day unless you are told not to by your health care provider. If you choose to breastfeed your baby, you may need to drink more water than this. °· Eat high-fiber foods every day. These foods may help prevent or relieve constipation. High-fiber foods include: °? Whole grain cereals and breads. °? Brown rice. °? Beans. °? Fresh fruits and vegetables. °Activity °· Return to your normal activities as told by your health care provider. Ask your health care provider  what activities are safe for you. °· Rest as much as possible. Try to rest or take a nap when your baby is sleeping. °· Do not lift anything that is heavier than your baby or 10 lb (4.5 kg) until your health care provider says that it is safe. °· Talk with your health care provider about when you can engage in sexual activity. This may depend on your: °? Risk of infection. °? Rate of healing. °? Comfort and desire to engage in sexual activity. °Vaginal Care °· If you have an episiotomy or a vaginal tear, check the area every day for signs of infection. Check for: °? More redness, swelling, or pain. °? More fluid or blood. °? Warmth. °? Pus or a bad smell. °· Do not use tampons or douches until your health care provider says this is safe. °· Watch for any blood clots that may pass from your vagina. These may look like clumps of dark red, brown, or black discharge. °General instructions °· Keep your perineum clean and dry as told by your health care provider. °· Wear loose, comfortable clothing. °· Wipe from front to back when you use the toilet. °· Ask your health care provider if you can shower or take a bath. If you had an episiotomy or a perineal tear during labor and delivery, your health care provider may tell you not to take baths for a certain length of time. °· Wear a bra that supports your breasts and fits you well. °· If possible, have someone help you with household activities and help care for your baby for at least a few days after   you leave the hospital. °· Keep all follow-up visits for you and your baby as told by your health care provider. This is important. °Contact a health care provider if: °· You have: °? Vaginal discharge that has a bad smell. °? Difficulty urinating. °? Pain when urinating. °? A sudden increase or decrease in the frequency of your bowel movements. °? More redness, swelling, or pain around your episiotomy or vaginal tear. °? More fluid or blood coming from your episiotomy or  vaginal tear. °? Pus or a bad smell coming from your episiotomy or vaginal tear. °? A fever. °? A rash. °? Little or no interest in activities you used to enjoy. °? Questions about caring for yourself or your baby. °· Your episiotomy or vaginal tear feels warm to the touch. °· Your episiotomy or vaginal tear is separating or does not appear to be healing. °· Your breasts are painful, hard, or turn red. °· You feel unusually sad or worried. °· You feel nauseous or you vomit. °· You pass large blood clots from your vagina. If you pass a blood clot from your vagina, save it to show to your health care provider. Do not flush blood clots down the toilet without having your health care provider look at them. °· You urinate more than usual. °· You are dizzy or light-headed. °· You have not breastfed at all and you have not had a menstrual period for 12 weeks after delivery. °· You have stopped breastfeeding and you have not had a menstrual period for 12 weeks after you stopped breastfeeding. °Get help right away if: °· You have: °? Pain that does not go away or does not get better with medicine. °? Chest pain. °? Difficulty breathing. °? Blurred vision or spots in your vision. °? Thoughts about hurting yourself or your baby. °· You develop pain in your abdomen or in one of your legs. °· You develop a severe headache. °· You faint. °· You bleed from your vagina so much that you fill two sanitary pads in one hour. °This information is not intended to replace advice given to you by your health care provider. Make sure you discuss any questions you have with your health care provider. °Document Released: 11/13/2000 Document Revised: 04/29/2016 Document Reviewed: 12/01/2015 °Elsevier Interactive Patient Education © 2018 [employer] ° °

## 2018-01-27 NOTE — Clinical Social Work Maternal (Signed)
CLINICAL SOCIAL WORK MATERNAL/CHILD NOTE  Patient Details  Name: Tanya Jacobs MRN: [id number] Date of Birth: Apr 09, 1986  Date:  01/27/2018  Clinical Social Worker Initiating Note:  Terri Piedra, [place] Date/Time: Initiated:  01/27/18/1030     Child's Name:  Tanya Jacobs   Biological Parents:  Mother, Father(Tanya Jacobs and Tanya Jacobs)   Need for Interpreter:  None   Reason for Referral:  Current Substance Use/Substance Use During Pregnancy    Address:  [address] [place], [place] [address]    Phone number:  [phone] (home)     Additional phone number:   Household Members/Support Persons (HM/SP):   Household Member/Support Person 1, Household Member/Support Person 2, Household Member/Support Person 3   HM/SP Name Relationship DOB or Age  HM/SP -[place] FOB 03/24/88  HM/SP -2 Maternal grandmother      HM/SP -3 Maternal grandfather      HM/SP -4        HM/SP -5        HM/SP -6        HM/SP -7        HM/SP -8          Natural Supports (not living in the home):  Immediate Family, Extended Family   Professional Supports: None   Employment:     Type of Work: FOB recently got a job at [employer]   Education:      Homebound arranged:    Museum/gallery curator Resources:  Medicaid   Other Resources:      Cultural/Religious Considerations Which May Impact Care: None stated.  Strengths:  Home prepared for child , Ability to meet basic needs , Pediatrician chosen   Psychotropic Medications:         Pediatrician:    [place]  Pediatrician List:   [facility]      Pediatrician Fax Number:    Risk Factors/Current Problems:  Substance Use    Cognitive State:  Alert , Able to Concentrate , Linear Thinking    Mood/Affect:  Euthymic , Interested , Calm    CSW Assessment: CSW met with parents in MOB's first floor room/104 to  complete assessment for marijuana use and offer support.  Parents were very pleasant and welcoming of CSW's visit.  MOB gave permission to speak openly with FOB in the room.  They report they are very excited about baby's arrival and about becoming parents.  This is their first baby.  FOB was holding baby skin to skin and noted that he is loving it.  Bonding is evident.  They state that have a good support system and everything they need for baby at home.   CSW inquired about marijuana use and MOB admits to using on occasion due to pain from pregnancy and feeling mildly anxious at times.  FOB admits to infrequent use as well.  They were understanding of hospital drug screen policy and of mandated reporting to Child Protective Services for positive screens.  They have already been told of baby's positive UDS.  CSW will make report, but this should not delay discharge.  Parents deny need for substance use resources and state they do not feel they have a substance abuse problem.   Parents seemed appreciative of CSW's discussion of PMADs and encouragement to seek medical care if they have concerns about their mental health at  any time.  CSW Plan/Description:  No Further Intervention Required/No Barriers to Discharge, Sudden Infant Death Syndrome (SIDS) Education, Perinatal Mood and Anxiety Disorder (PMADs) Education, [place], Child Protective Service Report , CSW Will Continue to Monitor Umbilical Cord Tissue Drug Screen Results and Make Report if Barnetta Chapel 01/27/2018, 1:34 PM

## 2018-04-08 IMAGING — US US MFM FETAL NUCHAL TRANSLUCENCY
1 series · 15 of 28 positions shown · non-contrast
Comparison: none

[Series 1: us mfm fetal nuchal translucency · 15 of 49 slices shown]
[im 1/49]
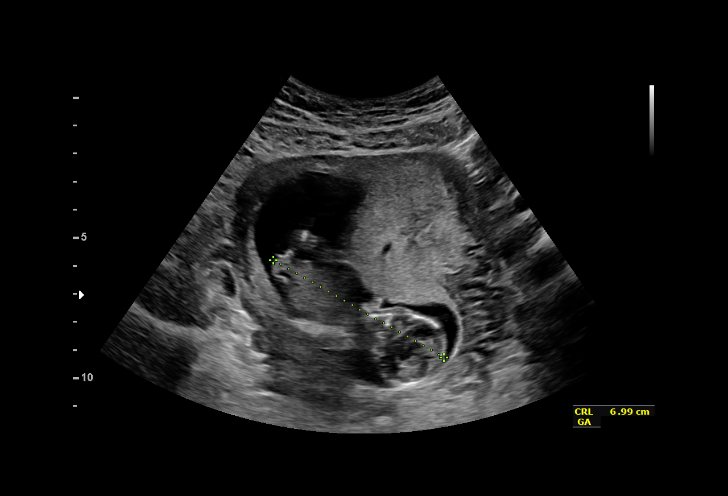
[im 4/49]
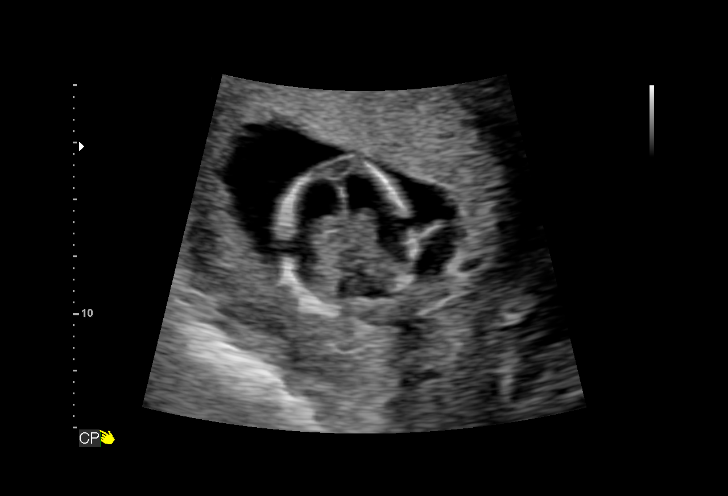
[im 8/49]
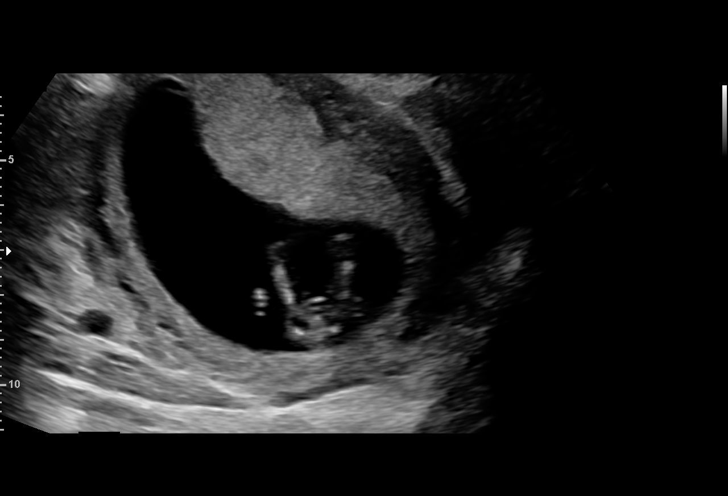
[im 11/49]
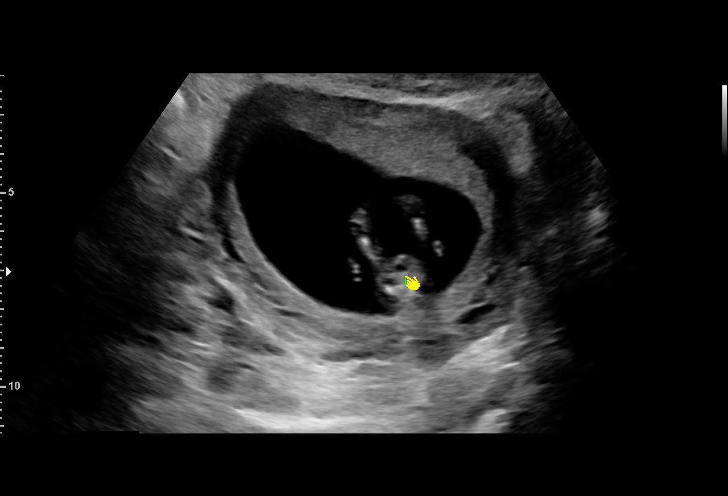
[im 15/49]
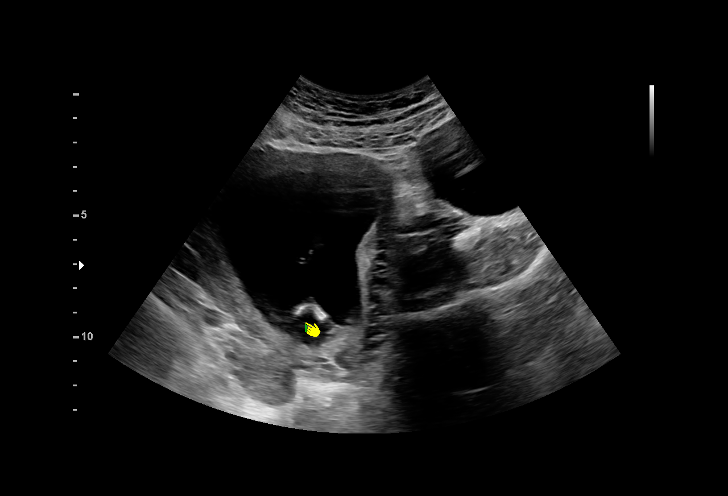
[im 18/49]
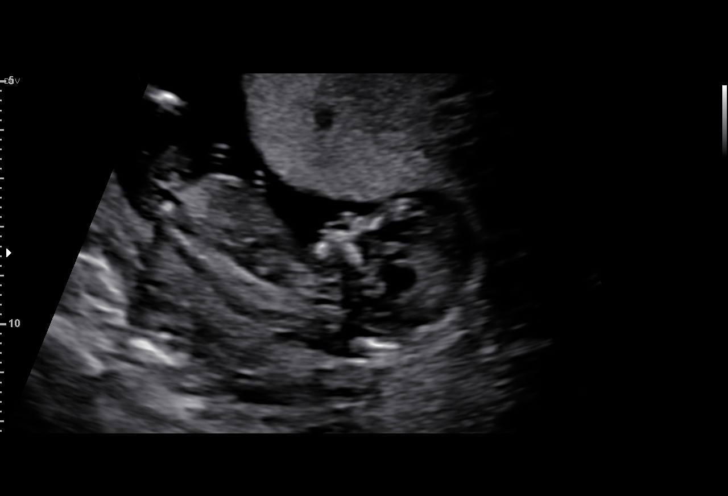
[im 22/49]
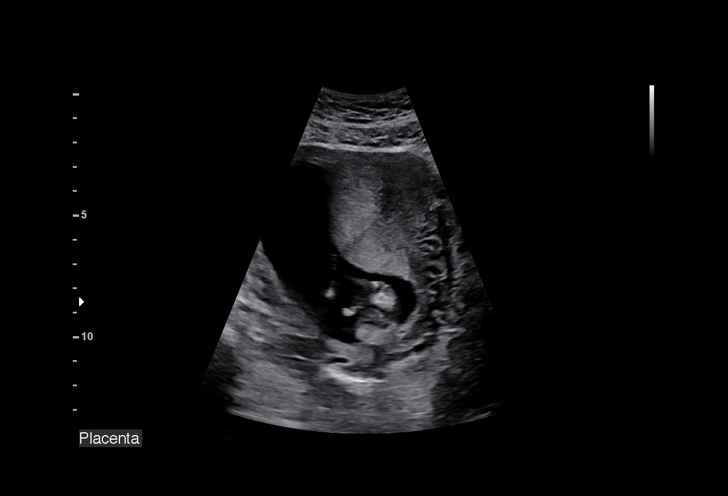
[im 25/49]
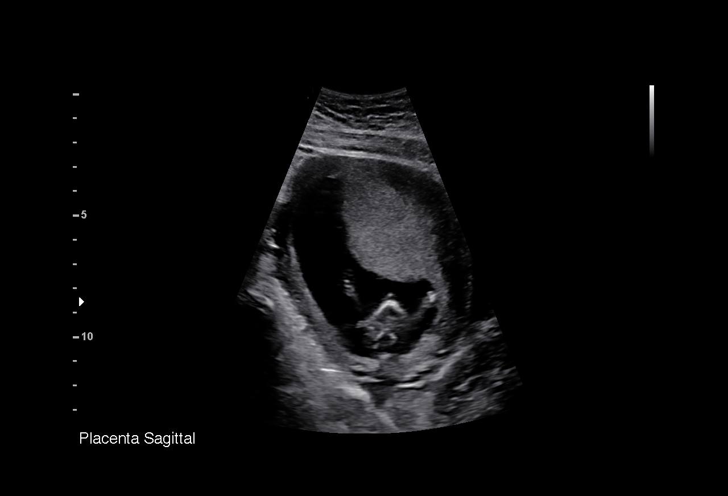
[im 27/49]
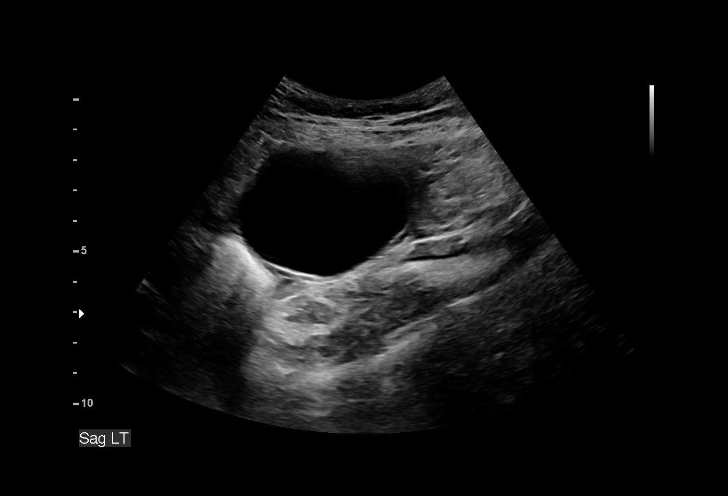
[im 31/49]
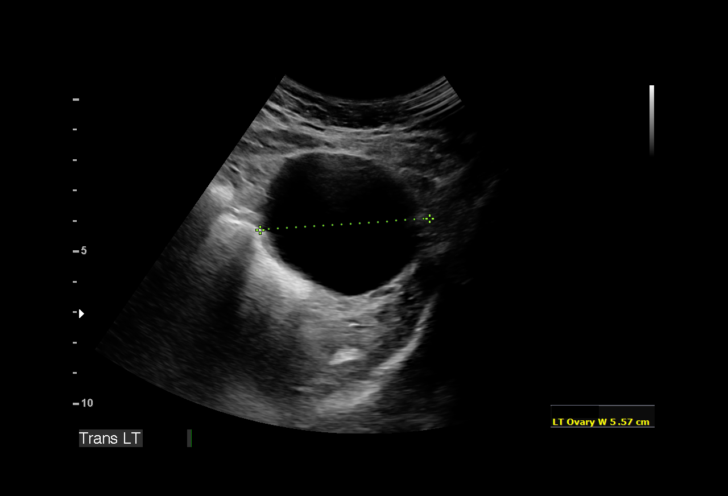
[im 34/49]
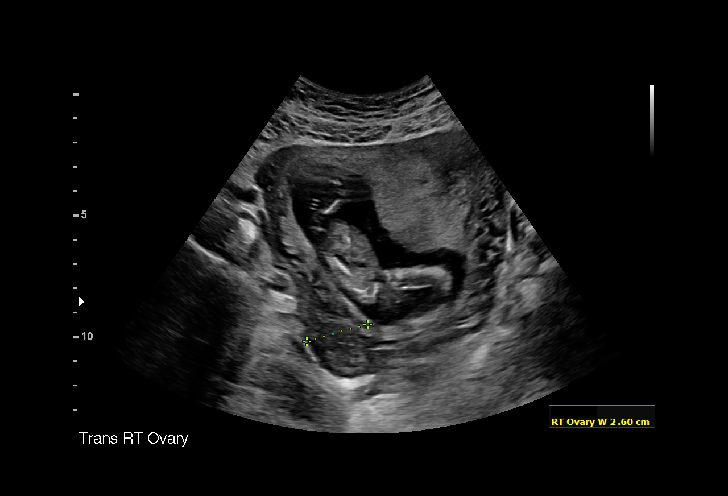
[im 38/49]
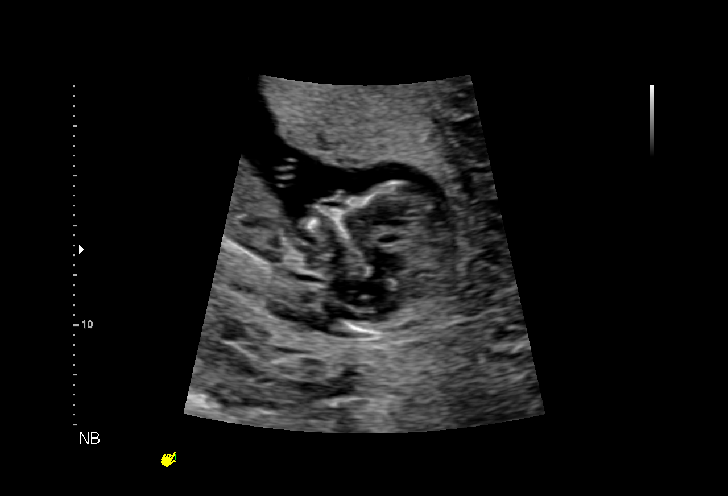
[im 41/49]
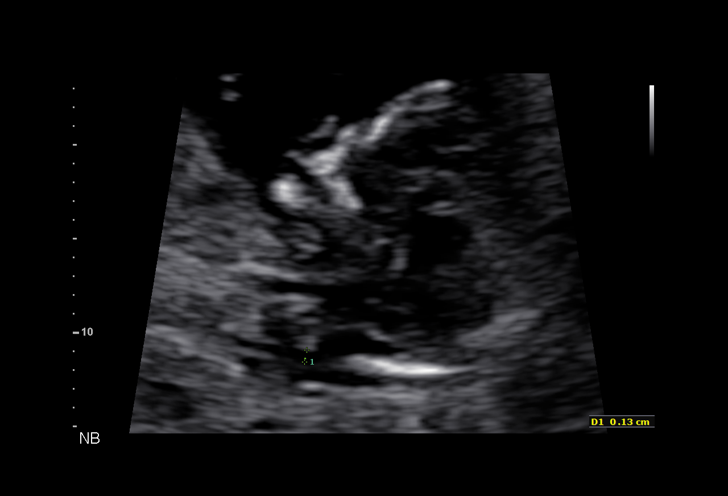
[im 45/49]
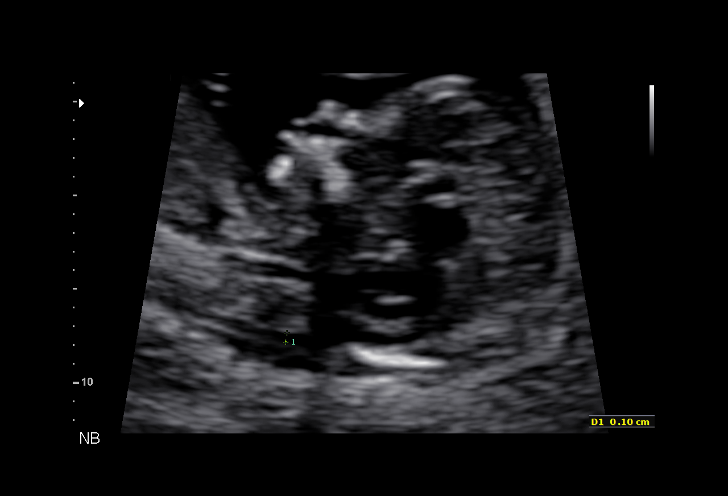
[im 49/49]
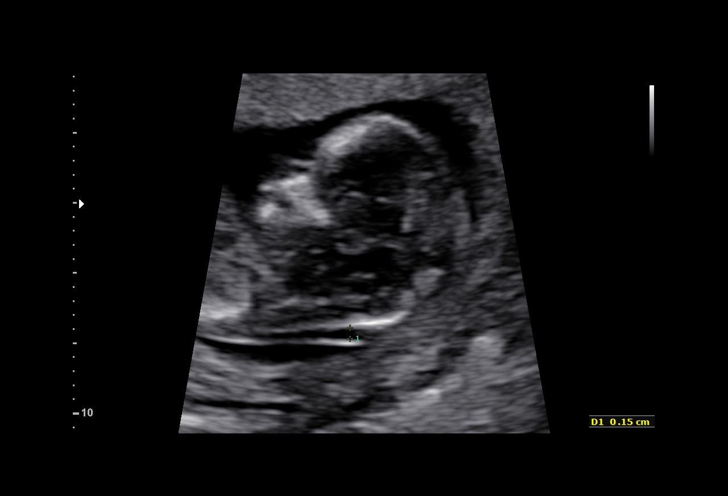

[15 of 28 positions shown; findings below may reference images not displayed]

YEYE NP

TRANSLUCENCY

1  SORIN OXENDINE           [id number]      [id number]     [id number]
Indications

12 weeks gestation of pregnancy
Encounter for nuchal translucency
OB History

Blood Type:            Height:  5'0"   Weight (lb):  130      BMI:
Gravidity:    5          SAB:   2
TOP:          2
Fetal Evaluation

Num Of Fetuses:     1
Fetal Heart         153
Rate(bpm):
Cardiac Activity:   Observed
Presentation:       Breech
Placenta:           Anterior, above cervical os

Amniotic Fluid
AFI FV:      Subjectively within normal limits
Biometry

CRL:      70.6  mm     G. Age:  13w 0d                  EDD:   01/28/18
Gestational Age

LMP:           12w 5d       Date:   04/25/17                 EDD:   01/30/18
Best:          12w 5d    Det. By:   LMP  (04/25/17)          EDD:   01/30/18
1st Trimester Genetic Sonogram Screening
CRL:            70.6  mm    G. Age:   13w 0d                 EDD:   01/28/18
Nasal Bone:                 Yes
Anatomy

Choroid Plexus:        Appears normal         Upper Extremities:      Visualized
Stomach:               Appears normal, left   Lower Extremities:      Visualized
sided
Bladder:               Appears normal
Cervix Uterus Adnexa

Cervix
Normal appearance by transabdominal scan.

Uterus
No abnormality visualized.

Left Ovary
Size(cm)       6.4 x    4.8    x  5.6       Vol(ml):
Simple cyst measuring 5.7cm

Right Ovary
Size(cm)       3.9 x    2.2    x  2.6       Vol(ml):
Within normal limits.
Impression

Single living intrauterine pregnancy at 69w7d.
Nasal bone present.
Unable to measure NT due to fetal position.
No gross fetal anomalies identified.
Recommendations

Additional options for genetic screening reviewed. Desires
quad screen at appropriate gestational age.
Offer level II U/S by 18 weeks

## 2018-12-23 ENCOUNTER — Other Ambulatory Visit: Payer: Self-pay

## 2018-12-23 ENCOUNTER — Encounter (HOSPITAL_BASED_OUTPATIENT_CLINIC_OR_DEPARTMENT_OTHER): Payer: Self-pay | Admitting: Emergency Medicine

## 2018-12-23 ENCOUNTER — Emergency Department (HOSPITAL_BASED_OUTPATIENT_CLINIC_OR_DEPARTMENT_OTHER)
Admission: EM | Admit: 2018-12-23 | Discharge: 2018-12-23 | Disposition: A | Discharge status: Home / Self Care | Payer: Medicaid Other | Attending: Emergency Medicine | Admitting: Emergency Medicine

## 2018-12-23 DIAGNOSIS — Z79899 Other long term (current) drug therapy: Secondary | ICD-10-CM | POA: Insufficient documentation

## 2018-12-23 DIAGNOSIS — G510 Bell's palsy: Secondary | ICD-10-CM | POA: Diagnosis not present

## 2018-12-23 DIAGNOSIS — R29810 Facial weakness: Secondary | ICD-10-CM | POA: Diagnosis present

## 2018-12-23 DIAGNOSIS — Z87891 Personal history of nicotine dependence: Secondary | ICD-10-CM | POA: Insufficient documentation

## 2018-12-23 MED ORDER — VALACYCLOVIR HCL 1 G PO TABS: 1000.0000 mg | ORAL_TABLET | Freq: Three times a day (TID) | ORAL | 0 refills | Status: AC

## 2018-12-23 MED ORDER — VALACYCLOVIR HCL 500 MG PO TABS
1000.0000 mg | ORAL_TABLET | Freq: Once | ORAL | Status: AC
Start: 2018-12-23 — End: 2018-12-23
  Administered 2018-12-23: 1000 mg via ORAL
  Filled 2018-12-23: qty 2

## 2018-12-23 MED ORDER — PREDNISONE 20 MG PO TABS: 60.0000 mg | ORAL_TABLET | Freq: Every day | ORAL | 0 refills | Status: AC

## 2018-12-23 MED ORDER — PREDNISONE 50 MG PO TABS
60.0000 mg | ORAL_TABLET | Freq: Once | ORAL | Status: AC
  Administered 2018-12-23: 23:00:00 60 mg via ORAL
  Filled 2018-12-23: qty 1

## 2018-12-23 NOTE — Discharge Instructions (Addendum)
Take Prednisone for the next week Valtrex 1000mg  TID for one week Use artificial tears for eyes and eye patch Please follow up with ENT

## 2018-12-23 NOTE — ED Provider Notes (Signed)
[facility] EMERGENCY DEPARTMENT Provider Note   CSN: [id number] Arrival date & time: 12/23/18  2219     History   Chief Complaint Chief Complaint  Patient presents with  . Facial Droop    HPI Tanya Jacobs is a 33 y.o. female who presents with right sided facial numbness and drooping.  No significant past medical history.  The patient states that last night her right eye was tearing a lot.  This morning the right side of her mouth felt weird and throughout the day she developed weakness of the right side of her face.  She denies any headache, dizziness, vision loss, unilateral extremity weakness, extremity paresthesias.  She notes she had a cold sore about a month ago but otherwise has been well.  She has never had this before.  Nothing makes it better or worse.  She has trouble closing her eye and moving her mouth.  HPI  Past Medical History:  Diagnosis Date  . Medical history non-contributory   . Miscarriage     Patient Active Problem List   Diagnosis Date Noted  . Supervision of normal pregnancy in third trimester 01/25/2018  . SVD (spontaneous vaginal delivery) 01/25/2018  . Incomplete spontaneous abortion 10/24/2016    Past Surgical History:  Procedure Laterality Date  . NO PAST SURGERIES       OB History    Gravida  5   Para  1   Term  1   Preterm      AB  4   Living  1     SAB  2   TAB  2   Ectopic  0   Multiple  0   Live Births  1            Home Medications    Prior to Admission medications   Medication Sig Start Date End Date Taking? Authorizing Provider  ibuprofen (ADVIL,MOTRIN) 600 MG tablet Take 1 tablet (600 mg total) by mouth every 6 (six) hours. 01/27/18   Degele, Kandra Nicolas, MD  Prenatal Vit-Fe Fumarate-FA (MULTIVITAMIN-PRENATAL) 27-0.8 MG TABS tablet Take 1 tablet by mouth daily at 12 noon.    [provider]    Family History Family History  Problem Relation Age of Onset  . Cancer Maternal Grandmother     . Diabetes Paternal Grandmother   . Hypertension Paternal Grandfather     Social History Social History   Tobacco Use  . Smoking status: Former Games developer  . Smokeless tobacco: Never Used  Substance Use Topics  . Alcohol use: Yes  . Drug use: Yes    Types: Marijuana    Comment: has used in the past month     Allergies   Chlorine   Review of Systems Review of Systems  Constitutional: Negative for fever.  Neurological: Positive for facial asymmetry, weakness (Eye and mouth) and numbness. Negative for dizziness, tremors, syncope, speech difficulty and headaches.  All other systems reviewed and are negative.    Physical Exam Updated Vital Signs BP 113/74   Pulse 74   Temp 98.4 F (36.9 C) (Oral)   Resp 18   Ht 5\' 1"  (1.549 m)   Wt 68 kg   SpO2 99%   BMI 28.34 kg/m   Physical Exam Vitals signs and nursing note reviewed.  Constitutional:      General: She is not in acute distress.    Appearance: Normal appearance. She is well-developed.     Comments: Calm and cooperative  HENT:  Head: Normocephalic and atraumatic.  Eyes:     General: No scleral icterus.       Right eye: No discharge.        Left eye: No discharge.     Conjunctiva/sclera: Conjunctivae normal.     Pupils: Pupils are equal, round, and reactive to light.  Neck:     Musculoskeletal: Normal range of motion.  Cardiovascular:     Rate and Rhythm: Normal rate.  Pulmonary:     Effort: Pulmonary effort is normal. No respiratory distress.  Abdominal:     General: There is no distension.  Skin:    General: Skin is warm and dry.  Neurological:     Mental Status: She is alert and oriented to person, place, and time.     Comments: Mental Status:  Alert, oriented, thought content appropriate, able to give a coherent history. Speech fluent without evidence of aphasia. Able to follow 2 step commands without difficulty.  Cranial Nerves:  II:  Peripheral visual fields grossly normal, pupils equal, round,  reactive to light III,IV, VI: ptosis not present, extra-ocular motions intact bilaterally  V,VII: smile is asymmetric. Right sided facial droop noted and she is unable to fully close the right eye. Subjective decreased sensation of the right side of face VIII: hearing grossly normal to voice  X: uvula elevates symmetrically  XI: bilateral shoulder shrug symmetric and strong XII: midline tongue extension without fassiculations Motor:  Normal tone. 5/5 in upper and lower extremities bilaterally including strong and equal grip strength and dorsiflexion/plantar flexion Sensory: Pinprick and light touch normal in all extremities.  Cerebellar: normal finger-to-nose with bilateral upper extremities Gait: normal gait and balance CV: distal pulses palpable throughout    Psychiatric:        Behavior: Behavior normal.      ED Treatments / Results  Labs (all labs ordered are listed, but only abnormal results are displayed) Labs Reviewed - No data to display  EKG None  Radiology No results found.  Procedures Procedures (including critical care time)  Medications Ordered in ED Medications  predniSONE (DELTASONE) tablet 60 mg (60 mg Oral Given 12/23/18 2258)  valACYclovir (VALTREX) tablet 1,000 mg (1,000 mg Oral Given 12/23/18 2257)     Initial Impression / Assessment and Plan / ED Course  I have reviewed the triage vital signs and the nursing notes.  Pertinent labs & imaging results that were available during my care of the patient were reviewed by me and considered in my medical decision making (see chart for details).  33 year old female presents with right sided facial droop and inability to fully close eye consistent with CN VII palsy. She has no other neurologic deficits. Her vitals are normal. She is not in any distress. Shared visit with Dr. Criss AlvineGoldston. Will give rx for Prednisone and Valtrex. She was advised to f/u with ENT  Final Clinical Impressions(s) / ED Diagnoses   Final  diagnoses:  Bell's palsy    ED Discharge Orders    None       Bethel BornGekas, Libni Fusaro Marie, PA-C 12/23/18 2324    Pricilla LovelessGoldston, Scott, MD 12/23/18 2330

## 2018-12-23 NOTE — ED Triage Notes (Signed)
Pt states last night her right eye became blurry. Pt states today when she was eating her mouth felt weird. States she noticed the right side of her mouth was not moving. Pt states it feel a little number than the other side. Denies  SHOb, CP, dizziness Lightheadedness, or extremity weakness

## 2021-10-13 ENCOUNTER — Emergency Department (HOSPITAL_BASED_OUTPATIENT_CLINIC_OR_DEPARTMENT_OTHER): Payer: Medicaid Other

## 2021-10-13 ENCOUNTER — Encounter (HOSPITAL_BASED_OUTPATIENT_CLINIC_OR_DEPARTMENT_OTHER): Payer: Self-pay

## 2021-10-13 ENCOUNTER — Emergency Department (HOSPITAL_BASED_OUTPATIENT_CLINIC_OR_DEPARTMENT_OTHER)
Admission: EM | Admit: 2021-10-13 | Discharge: 2021-10-13 | Disposition: A | Discharge status: Home / Self Care | Payer: Medicaid Other | Attending: Emergency Medicine | Admitting: Emergency Medicine

## 2021-10-13 DIAGNOSIS — Z87891 Personal history of nicotine dependence: Secondary | ICD-10-CM | POA: Insufficient documentation

## 2021-10-13 DIAGNOSIS — R112 Nausea with vomiting, unspecified: Secondary | ICD-10-CM | POA: Insufficient documentation

## 2021-10-13 DIAGNOSIS — K5900 Constipation, unspecified: Secondary | ICD-10-CM | POA: Diagnosis not present

## 2021-10-13 DIAGNOSIS — Z20822 Contact with and (suspected) exposure to covid-19: Secondary | ICD-10-CM | POA: Insufficient documentation

## 2021-10-13 DIAGNOSIS — R1032 Left lower quadrant pain: Secondary | ICD-10-CM | POA: Diagnosis present

## 2021-10-13 DIAGNOSIS — R001 Bradycardia, unspecified: Secondary | ICD-10-CM | POA: Insufficient documentation

## 2021-10-13 LAB — URINALYSIS, MICROSCOPIC (REFLEX)

## 2021-10-13 LAB — COMPREHENSIVE METABOLIC PANEL WITH GFR
ALT: 15 U/L (ref 0–44)
AST: 17 U/L (ref 15–41)
Albumin: 4.2 g/dL (ref 3.5–5.0)
Alkaline Phosphatase: 48 U/L (ref 38–126)
Anion gap: 7 (ref 5–15)
BUN: 11 mg/dL (ref 6–20)
CO2: 28 mmol/L (ref 22–32)
Calcium: 9.3 mg/dL (ref 8.9–10.3)
Chloride: 102 mmol/L (ref 98–111)
Creatinine, Ser: 0.6 mg/dL (ref 0.44–1.00)
GFR, Estimated: >60 mL/min
Glucose, Bld: 87 mg/dL (ref 70–99)
Potassium: 4.1 mmol/L (ref 3.5–5.1)
Sodium: 137 mmol/L (ref 135–145)
Total Bilirubin: 0.5 mg/dL (ref 0.3–1.2)
Total Protein: 7.8 g/dL (ref 6.5–8.1)

## 2021-10-13 LAB — RESP PANEL BY RT-PCR (FLU A&B, COVID) ARPGX2
Influenza A by PCR: NEGATIVE
Influenza B by PCR: NEGATIVE
SARS Coronavirus 2 by RT PCR: NEGATIVE

## 2021-10-13 LAB — URINALYSIS, ROUTINE W REFLEX MICROSCOPIC
Bilirubin Urine: NEGATIVE
Glucose, UA: NEGATIVE mg/dL
Ketones, ur: 15 mg/dL — AB
Leukocytes,Ua: NEGATIVE
Nitrite: NEGATIVE
Protein, ur: NEGATIVE mg/dL
Specific Gravity, Urine: 1.025 (ref 1.005–1.030)
pH: 6 (ref 5.0–8.0)

## 2021-10-13 LAB — CBC
HCT: 41.4 % (ref 36.0–46.0)
Hemoglobin: 13.8 g/dL (ref 12.0–15.0)
MCH: 31.2 pg (ref 26.0–34.0)
MCHC: 33.3 g/dL (ref 30.0–36.0)
MCV: 93.7 fL (ref 80.0–100.0)
Platelets: 416 K/uL — ABNORMAL HIGH (ref 150–400)
RBC: 4.42 MIL/uL (ref 3.87–5.11)
RDW: 11.9 % (ref 11.5–15.5)
WBC: 10.5 K/uL (ref 4.0–10.5)
nRBC: 0 % (ref 0.0–0.2)

## 2021-10-13 LAB — PREGNANCY, URINE: Preg Test, Ur: NEGATIVE

## 2021-10-13 LAB — LIPASE, BLOOD: Lipase: 25 U/L (ref 11–51)

## 2021-10-13 MED ORDER — SODIUM CHLORIDE 0.9 % IV BOLUS
1000.0000 mL | Freq: Once | INTRAVENOUS | Status: AC
  Administered 2021-10-13: 1000 mL via INTRAVENOUS

## 2021-10-13 NOTE — ED Notes (Signed)
Patient transported to X-ray 

## 2021-10-13 NOTE — Discharge Instructions (Signed)
Please follow up with an OBGYN to be evaluated for possible ovarian cysts. If your symptoms return, please return to the emergency department.

## 2021-10-13 NOTE — ED Notes (Signed)
Pt transported to xray 

## 2021-10-13 NOTE — ED Triage Notes (Signed)
Pt c/o left sided abdominal pain x 2 days. States intermittent pain. Has had vomiting, unable to tolerate PO since Saturday. Hx of ovarian cysts & constipation.

## 2021-10-13 NOTE — ED Notes (Signed)
Pt made aware of UA but unable to urinate at this time.

## 2021-10-13 NOTE — ED Provider Notes (Signed)
[facility] Provider Note   CSN: [id number] Arrival date & time: 10/13/21  1018     History Chief Complaint  Patient presents with   Abdominal Pain    Tanya Jacobs is a 35 y.o. female.  Patient with past medical history of constipation, ectopic pregnancy, and ovarian cyst.  She presents with complaints of left lower quadrant and suprapubic abdominal pain she had yesterday.  She states that it was severe and radiated up to the middle of her abdomen into the epigastric region.  She had associated vomiting and was not able to keep anything p.o. down yesterday.  Patient states that she feels better today.  She still has not eaten much or had much to drink, but the pain is now gone.  She did have a bowel movement yesterday and describes it as soft.  She is not have regular bowel movements and does not go very often which is normal for her.  Patient says there is little to no chance that she is pregnant because she has not been sexually active.  Patient's most recent period was on November 2 and she is normally pretty regular.  She denies any chest pain, shortness of breath, dysuria, hematuria, fever, or chills.   Abdominal Pain Associated symptoms: constipation, nausea and vomiting   Associated symptoms: no chest pain, no chills, no cough, no diarrhea, no dysuria, no fever, no hematuria, no shortness of breath, no sore throat, no vaginal bleeding and no vaginal discharge       Past Medical History:  Diagnosis Date   Medical history non-contributory    Miscarriage     Patient Active Problem List   Diagnosis Date Noted   Supervision of normal pregnancy in third trimester 01/25/2018   SVD (spontaneous vaginal delivery) 01/25/2018   Incomplete spontaneous abortion 10/24/2016    Past Surgical History:  Procedure Laterality Date   NO PAST SURGERIES       OB History     Gravida  5   Para  1   Term  1   Preterm      AB  4   Living  1       SAB  2   IAB  2   Ectopic  0   Multiple  0   Live Births  1           Family History  Problem Relation Age of Onset   Cancer Maternal Grandmother    Diabetes Paternal Grandmother    Hypertension Paternal Grandfather     Social History   Tobacco Use   Smoking status: Former   Smokeless tobacco: Never  Substance Use Topics   Alcohol use: Yes   Drug use: Yes    Types: Marijuana    Comment: has used in the past month    Home Medications Prior to Admission medications   Medication Sig Start Date End Date Taking? Authorizing Provider  ibuprofen (ADVIL,MOTRIN) 600 MG tablet Take 1 tablet (600 mg total) by mouth every 6 (six) hours. 01/27/18   Degele, Jenne Pane, MD  predniSONE (DELTASONE) 20 MG tablet Take 3 tablets (60 mg total) by mouth daily. 12/23/18   Recardo Evangelist, PA-C  Prenatal Vit-Fe Fumarate-FA (MULTIVITAMIN-PRENATAL) 27-0.8 MG TABS tablet Take 1 tablet by mouth daily at 12 noon.    [provider]    Allergies    Chlorine  Review of Systems   Review of Systems  Constitutional:  Positive for appetite change. Negative  for chills and fever.  HENT:  Negative for congestion, rhinorrhea and sore throat.   Eyes:  Negative for visual disturbance.  Respiratory:  Negative for cough, chest tightness and shortness of breath.   Cardiovascular:  Negative for chest pain, palpitations and leg swelling.  Gastrointestinal:  Positive for abdominal pain, constipation, nausea and vomiting. Negative for blood in stool and diarrhea.  Genitourinary:  Positive for pelvic pain. Negative for dysuria, flank pain, hematuria, vaginal bleeding and vaginal discharge.  Musculoskeletal:  Negative for back pain.  Skin:  Negative for rash and wound.  Neurological:  Negative for dizziness, syncope, weakness, light-headedness and headaches.  Psychiatric/Behavioral:  Negative for confusion.   All other systems reviewed and are negative.  Physical Exam Updated Vital Signs BP (!)  111/39 (BP Location: Left Arm)   Pulse (!) 55   Temp 98.2 F (36.8 C) (Oral)   Resp 18   Ht 5\' 1"  (1.549 m)   Wt 50.3 kg   LMP 09/30/2021 (Approximate)   SpO2 97%   BMI 20.97 kg/m   Physical Exam Vitals and nursing note reviewed.  Constitutional:      General: She is not in acute distress.    Appearance: Normal appearance. She is not ill-appearing, toxic-appearing or diaphoretic.  HENT:     Head: Normocephalic and atraumatic.     Nose: No nasal deformity.     Mouth/Throat:     Lips: Pink. No lesions.     Mouth: Mucous membranes are moist. No injury, lacerations, oral lesions or angioedema.     Pharynx: Oropharynx is clear. Uvula midline. No pharyngeal swelling, oropharyngeal exudate, posterior oropharyngeal erythema or uvula swelling.  Eyes:     General: Gaze aligned appropriately. No scleral icterus.       Right eye: No discharge.        Left eye: No discharge.     Conjunctiva/sclera: Conjunctivae normal.     Right eye: Right conjunctiva is not injected. No exudate or hemorrhage.    Left eye: Left conjunctiva is not injected. No exudate or hemorrhage. Cardiovascular:     Rate and Rhythm: Regular rhythm. Bradycardia present.     Pulses: Normal pulses.          Radial pulses are 2+ on the right side and 2+ on the left side.       Dorsalis pedis pulses are 2+ on the right side and 2+ on the left side.     Heart sounds: Normal heart sounds, S1 normal and S2 normal. Heart sounds not distant. No murmur heard.   No friction rub. No gallop. No S3 or S4 sounds.  Pulmonary:     Effort: Pulmonary effort is normal. No accessory muscle usage or respiratory distress.     Breath sounds: Normal breath sounds. No stridor. No wheezing, rhonchi or rales.  Chest:     Chest wall: No tenderness.  Abdominal:     General: Abdomen is flat. Bowel sounds are normal. There is no distension.     Palpations: Abdomen is soft. There is no mass or pulsatile mass.     Tenderness: There is no abdominal  tenderness. There is no right CVA tenderness, left CVA tenderness, guarding or rebound. Negative signs include Murphy's sign, Rovsing's sign and McBurney's sign.     Hernia: No hernia is present.     Comments: No active abdominal tenderness to palpation.  Musculoskeletal:     Right lower leg: No edema.     Left lower leg: No edema.  Skin:    General: Skin is warm and dry.     Coloration: Skin is not jaundiced or pale.     Findings: No bruising, erythema, lesion or rash.  Neurological:     General: No focal deficit present.     Mental Status: She is alert and oriented to person, place, and time.     GCS: GCS eye subscore is 4. GCS verbal subscore is 5. GCS motor subscore is 6.  Psychiatric:        Mood and Affect: Mood normal.        Behavior: Behavior normal. Behavior is cooperative.    ED Results / Procedures / Treatments   Labs (all labs ordered are listed, but only abnormal results are displayed) Labs Reviewed  CBC - Abnormal; Notable for the following components:      Result Value   Platelets 416 (*)    All other components within normal limits  URINALYSIS, ROUTINE W REFLEX MICROSCOPIC - Abnormal; Notable for the following components:   Hgb urine dipstick SMALL (*)    Ketones, ur 15 (*)    All other components within normal limits  URINALYSIS, MICROSCOPIC (REFLEX) - Abnormal; Notable for the following components:   Bacteria, UA FEW (*)    All other components within normal limits  RESP PANEL BY RT-PCR (FLU A&B, COVID) ARPGX2  LIPASE, BLOOD  COMPREHENSIVE METABOLIC PANEL  PREGNANCY, URINE    EKG None  Radiology DG Abd Acute W/Chest  Result Date: 10/13/2021 CLINICAL DATA:  Left lower quadrant abdominal pain. EXAM: DG ABDOMEN ACUTE WITH 1 VIEW CHEST COMPARISON:  None. FINDINGS: There is no evidence of dilated bowel loops or free intraperitoneal air. No radiopaque calculi or other significant radiographic abnormality is seen. Heart size and mediastinal contours are  within normal limits. Both lungs are clear. IMPRESSION: Negative abdominal radiographs.  No acute cardiopulmonary disease. Electronically Signed   By: San Morelle M.D.   On: 10/13/2021 13:20    Procedures Procedures   Medications Ordered in ED Medications  sodium chloride 0.9 % bolus 1,000 mL (0 mLs Intravenous Stopped 10/13/21 1356)    ED Course  I have reviewed the triage vital signs and the nursing notes.  Pertinent labs & imaging results that were available during my care of the patient were reviewed by me and considered in my medical decision making (see chart for details).    MDM Rules/Calculators/A&P                          This is a well-appearing 35 year old female presents the emergency department with left lower quadrant abdominal pain that was present yesterday along with nausea and vomiting.  She is completely asymptomatic today but presented here on the referral of her PCP to be evaluated. Patient is afebrile and hemodynamically stable. Exam is reassuring.  I have evaluated all labs and imaging. -CBC reassuring, CMP normal, respiratory panel negative, pregnancy negative, urine with small amount of ketones likely secondary to decreased p.o. intake, however no evidence of urinary tract infection. -Abdominal x-ray without significant stool burden  I doubt the patient has ovarian torsion resolution of symptoms prior to my assessment.  Do not feel this is related to constipation given normal abdominal x-ray with minimal stool burden.  This could be related to an ovarian cyst.  Also possibility that could have been a gastroenteritis that has now resolved.  I discussed getting a pelvic and transvaginal ultrasound to evaluate for ovarian  cyst, however patient declines at this time and will follow up with an OB/GYN provider regarding the symptoms.  Feel patient is stable for discharge.  Return precautions provided.   Final Clinical Impression(s) / ED Diagnoses Final  diagnoses:  Left lower quadrant abdominal pain    Rx / DC Orders ED Discharge Orders     None        Sheila Oats 10/13/21 1915    Luna Fuse, MD 10/20/21 [phone]
